# Patient Record
Sex: Female | Born: 1975 | Hispanic: No | Marital: Married | Smoking: Never smoker
Health system: Southern US, Community
[De-identification: ages and names within clinical notes are randomized; demographics above are authoritative.]

## PROBLEM LIST (undated history)

## (undated) DIAGNOSIS — K219 Gastro-esophageal reflux disease without esophagitis: Secondary | ICD-10-CM

---

## 2010-07-29 ENCOUNTER — Ambulatory Visit: Payer: Self-pay | Admitting: General Practice

## 2012-08-06 ENCOUNTER — Ambulatory Visit (INDEPENDENT_AMBULATORY_CARE_PROVIDER_SITE_OTHER): Payer: 59 | Admitting: Internal Medicine

## 2012-08-06 ENCOUNTER — Encounter: Payer: Self-pay | Admitting: Internal Medicine

## 2012-08-06 VITALS — BP 118/70 | HR 79 | Temp 97.3°F | Resp 18 | Ht 62.0 in | Wt 90.0 lb

## 2012-08-06 DIAGNOSIS — R6889 Other general symptoms and signs: Secondary | ICD-10-CM

## 2012-08-06 DIAGNOSIS — B977 Papillomavirus as the cause of diseases classified elsewhere: Secondary | ICD-10-CM

## 2012-08-06 DIAGNOSIS — E559 Vitamin D deficiency, unspecified: Secondary | ICD-10-CM

## 2012-08-06 DIAGNOSIS — IMO0002 Reserved for concepts with insufficient information to code with codable children: Secondary | ICD-10-CM

## 2012-08-06 DIAGNOSIS — E785 Hyperlipidemia, unspecified: Secondary | ICD-10-CM

## 2012-08-06 DIAGNOSIS — R61 Generalized hyperhidrosis: Secondary | ICD-10-CM

## 2012-08-06 DIAGNOSIS — R87619 Unspecified abnormal cytological findings in specimens from cervix uteri: Secondary | ICD-10-CM

## 2012-08-06 NOTE — Progress Notes (Signed)
Subjective:    Patient ID: Charlene Rogers, female    DOB: [birth year], 37 y.o.   MRN: [id number]  HPI New pt here for first visit for consultation kindly referred by Arnette Felts PA at [facility].   Her  GYN care is by D Dr. Juliene Pina.  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    Pt reports S/S complex that has been going on for quite some time.  She is here to see me as she is wondering if her night sweats are hormonoally related.  She describes chronic issues of her stomach and placed herself on a wheat free diete which seems to help.  She describes her vitamin D level is very low and she is being supplemented with Vitamin D by her Gyn Md.  She also recently was given folic acid by her GYN for reasons that  pt reports of HPV positivity  and an abnormal pap.  I do not have any of the GYN records.   She tells me she is tired. She is a single mother raising a 38 yo.  She has not been sexually active for the past 5-6 years She is G1P1AB0.  LMP 06/16/2012 and she describes flow as 3-5 days.  She reports no irregular menses.  Regarding her sweats,  She states they only occur during the night , none during the day. They have been more frequent over the last 4 weeks.    Sweating occurs under her arms, across her chest, and  a few times on her upper lip.  She denies diarrhea, she noted one time her ears were red but She denies persistant flushing during the sweating episodes.  She states around her menses she does feels tachycardia occasionally.   Her BP has always been normal by her reports.  She denies weight loss.  At times after her menses she has some epigastric pain that only last 2-3 minutes.  NO L arm or jaw pain, no radiation to back, no heartburn.  Folic acid was started in September and she stopped this and thinks perhaps symtptoms improved.   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  She was counseled to take TB prophylaxis medication but she read the side effects and did not take any medication. She is unsure who evaluated her for her positive PPD.  Review of her records from [place] CXR done 06/2012 revealing no acute process.    Upon review of records kindly sent by Arnette Felts,  Her FSH is 6.7,  TSH  3.5,  LH  4.3 and estradiol 30.9.  She states labs were drawn approx 10 days after her cycle.  She denies any lumps or masses in neck or under arms  Allergies not on file No past medical history on file. No past surgical history on file. History   Social History  . Marital Status: Divorced    Spouse Name: N/A    Number of Children: N/A  . Years of Education: N/A   Occupational History  . Not on file.   Social History Main Topics  . Smoking status: Not on file  . Smokeless tobacco: Not on file  . Alcohol Use: Not on file  . Drug Use: Not on file  .  Sexually Active: Not on file   Other Topics Concern  . Not on file   Social History Narrative  . No narrative on file   No family history on file. There is no problem list on file for this patient.  No current outpatient prescriptions on file prior to visit.   No current facility-administered medications on file prior to visit.       Review of Systems See HPI    Objective:   Physical Exam Physical Exam  Nursing note and vitals reviewed.  Alert, smiling  Constitutional: She is oriented to person, place, and time. She appears well-developed and well-nourished.  HENT:  Head: Normocephalic and atraumatic.  Neck:  No thyromegaly and no nodules felt on exam Cardiovascular: Normal rate and regular rhythm. Exam reveals no gallop and no friction rub.  No murmur heard.  Pulmonary/Chest: Breath sounds normal. She has no wheezes. She has no rales.  Neurological: She is alert and oriented to  person, place, and time.  Lymph  I do not palpate enlarged lymph nodes in neck, clavicle, axillary of femoral areas. Skin: Skin is warm and dry.  Psychiatric: She has a normal mood and affect. Her behavior is normal. ExtL:  No edema           Assessment & Plan:  NIght sweats:  I counseled pt it is very unlikely that her sweating is due to a lack of or diminution of estrogen or progesterone especially in light of lab findings and regular menses.  She has no symptoms of carcinoid syndrome or pheochromocytoma.    She does not wish lab testing at this time for carcinoid or pheochromotcytoma but I would watch for any presenting signs  in the furture and re-offer testing at that time.  TSH is normal as is her thyroid exam.     PPD positive.  She grew up in an endemic area for TB exposure,  Shehad a normal CXR in January but I did strongly counsel pt that she should take TB prophylaxis meds.  She declines at this time despite my counsel   Pt is encouraged to return if any worsening symptoms, diarrhea, persistant flushing, weight loss, or hypertension occurs.

## 2012-08-07 ENCOUNTER — Encounter: Payer: Self-pay | Admitting: Internal Medicine

## 2012-08-07 DIAGNOSIS — IMO0002 Reserved for concepts with insufficient information to code with codable children: Secondary | ICD-10-CM | POA: Insufficient documentation

## 2012-08-07 DIAGNOSIS — R61 Generalized hyperhidrosis: Secondary | ICD-10-CM | POA: Insufficient documentation

## 2012-08-07 DIAGNOSIS — R87619 Unspecified abnormal cytological findings in specimens from cervix uteri: Secondary | ICD-10-CM | POA: Insufficient documentation

## 2012-08-07 DIAGNOSIS — E785 Hyperlipidemia, unspecified: Secondary | ICD-10-CM | POA: Insufficient documentation

## 2012-08-07 DIAGNOSIS — E559 Vitamin D deficiency, unspecified: Secondary | ICD-10-CM | POA: Insufficient documentation

## 2012-08-07 NOTE — Patient Instructions (Addendum)
See me as needed 

## 2012-10-13 ENCOUNTER — Encounter (HOSPITAL_BASED_OUTPATIENT_CLINIC_OR_DEPARTMENT_OTHER): Payer: Self-pay | Admitting: *Deleted

## 2012-10-13 ENCOUNTER — Emergency Department (HOSPITAL_BASED_OUTPATIENT_CLINIC_OR_DEPARTMENT_OTHER): Payer: 59

## 2012-10-13 ENCOUNTER — Emergency Department (HOSPITAL_BASED_OUTPATIENT_CLINIC_OR_DEPARTMENT_OTHER)
Admission: EM | Admit: 2012-10-13 | Discharge: 2012-10-13 | Disposition: A | Discharge status: Home / Self Care | Payer: 59 | Attending: Emergency Medicine | Admitting: Emergency Medicine

## 2012-10-13 DIAGNOSIS — R0602 Shortness of breath: Secondary | ICD-10-CM | POA: Insufficient documentation

## 2012-10-13 DIAGNOSIS — I10 Essential (primary) hypertension: Secondary | ICD-10-CM | POA: Insufficient documentation

## 2012-10-13 DIAGNOSIS — R51 Headache: Secondary | ICD-10-CM | POA: Insufficient documentation

## 2012-10-13 DIAGNOSIS — K219 Gastro-esophageal reflux disease without esophagitis: Secondary | ICD-10-CM | POA: Insufficient documentation

## 2012-10-13 DIAGNOSIS — R000 Tachycardia, unspecified: Secondary | ICD-10-CM | POA: Insufficient documentation

## 2012-10-13 LAB — CBC WITH DIFFERENTIAL/PLATELET
Eosinophils Absolute: 0 10*3/uL (ref 0.0–0.7)
Hemoglobin: 13.6 g/dL (ref 12.0–15.0)
Lymphocytes Relative: 19 % (ref 12–46)
Lymphs Abs: 1.1 10*3/uL (ref 0.7–4.0)
MCH: 31.9 pg (ref 26.0–34.0)
Monocytes Relative: 5 % (ref 3–12)
Neutrophils Relative %: 76 % (ref 43–77)
RBC: 4.26 MIL/uL (ref 3.87–5.11)
WBC: 5.7 10*3/uL (ref 4.0–10.5)

## 2012-10-13 LAB — BASIC METABOLIC PANEL
BUN: 5 mg/dL — ABNORMAL LOW (ref 6–23)
CO2: 24 mEq/L (ref 19–32)
Chloride: 100 mEq/L (ref 96–112)
GFR calc non Af Amer: >90 mL/min (ref >90)
Glucose, Bld: 122 mg/dL — ABNORMAL HIGH (ref 70–99)
Potassium: 3.3 mEq/L — ABNORMAL LOW (ref 3.5–5.1)

## 2012-10-13 LAB — TROPONIN I
Troponin I: <0.3 ng/mL (ref <0.30)
Troponin I: <0.3 ng/mL (ref <0.30)

## 2012-10-13 MED ORDER — OMEPRAZOLE 20 MG PO CPDR: 20.0000 mg | DELAYED_RELEASE_CAPSULE | Freq: Every day | ORAL | Status: DC

## 2012-10-13 MED ORDER — ASPIRIN 81 MG PO CHEW
324.0000 mg | CHEWABLE_TABLET | Freq: Once | ORAL | Status: AC
  Administered 2012-10-13: 324 mg via ORAL
  Filled 2012-10-13: qty 4

## 2012-10-13 MED ORDER — SODIUM CHLORIDE 0.9 % IV BOLUS (SEPSIS)
1000.0000 mL | Freq: Once | INTRAVENOUS | Status: AC
  Administered 2012-10-13: 1000 mL via INTRAVENOUS

## 2012-10-13 MED ORDER — GI COCKTAIL ~~LOC~~
30.0000 mL | Freq: Once | ORAL | Status: AC
  Administered 2012-10-13: 30 mL via ORAL
  Filled 2012-10-13: qty 30

## 2012-10-13 NOTE — ED Provider Notes (Signed)
History     CSN: [id number]  Arrival date & time 10/13/12  1730   First MD Initiated Contact with Patient 10/13/12 1731      Chief Complaint  Patient presents with  . Chest Pain    (Consider location/radiation/quality/duration/timing/severity/associated sxs/prior treatment) HPI Comments: Patient presents emergency department with chief complaint of chest pain. Patient states that she felt some sharp chest pain on left side of her chest early this morning. She also states that she had some "heaviness" to her left arm, with associated shortness of breath and clamminess. She states that she was not doing anything when she first noticed the symptoms. She states that following the symptoms, she needed to have a bowel movement, which did not affect her symptoms. She also complains of mild headache, and states that her shortness of breath makes her feel like something is caught in her throat. She is only having mild pain now. She is tried taking bystolic for her symptoms.  She has a history of tachycardia and hypertension. She states that her heart normally races prior to having her period, however her heart rate has been quick today, and her last menstrual period was 10 days ago  The history is provided by the patient. No language interpreter was used.    Past Medical History  Diagnosis Date  . Hypertension     History reviewed. No pertinent past surgical history.  Family History  Problem Relation Age of Onset  . Hypertension Mother   . Heart disease Maternal Grandmother   . Heart disease Maternal Grandfather     History  Substance Use Topics  . Smoking status: Never Smoker   . Smokeless tobacco: Not on file  . Alcohol Use: No    OB History   Grav Para Term Preterm Abortions TAB SAB Ect Mult Living   1         1      Review of Systems  All other systems reviewed and are negative.    Allergies  Review of patient's allergies indicates no known allergies.  Home  Medications   Current Outpatient Rx  Name  Route  Sig  Dispense  Refill  . cholecalciferol (VITAMIN D) 1000 UNITS tablet   Oral   Take 2,000 Units by mouth every other day.         . MULTIPLE MINERALS PO   Oral   Take by mouth.           BP 147/85  Pulse 138  Temp(Src) 98.4 F (36.9 C) (Oral)  Resp 20  Ht 5\' 2"  (1.575 m)  Wt 86 lb (39.009 kg)  BMI 15.73 kg/m2  SpO2 100%  LMP 10/06/2012  Physical Exam  Nursing note and vitals reviewed. Constitutional: She is oriented to person, place, and time. She appears well-developed and well-nourished.  HENT:  Head: Normocephalic and atraumatic.  Eyes: Conjunctivae and EOM are normal. Pupils are equal, round, and reactive to light.  Neck: Normal range of motion. Neck supple.  Cardiovascular: Regular rhythm.  Exam reveals no gallop and no friction rub.   No murmur heard. Tachycardia  Pulmonary/Chest: Effort normal and breath sounds normal. No respiratory distress. She has no wheezes. She has no rales. She exhibits no tenderness.  Abdominal: Soft. Bowel sounds are normal. She exhibits no distension and no mass. There is no tenderness. There is no rebound and no guarding.  Musculoskeletal: Normal range of motion. She exhibits no edema and no tenderness.  Neurological: She is alert and  oriented to person, place, and time.  Skin: Skin is warm and dry.  Psychiatric: She has a normal mood and affect. Her behavior is normal. Judgment and thought content normal.    ED Course  Procedures (including critical care time)  Labs Reviewed  TROPONIN I  CBC WITH DIFFERENTIAL  BASIC METABOLIC PANEL   Results for orders placed during the hospital encounter of 10/13/12  TROPONIN I      Result Value Range   Troponin I <0.30  <0.30 ng/mL  CBC WITH DIFFERENTIAL      Result Value Range   WBC 5.7  4.0 - 10.5 K/uL   RBC 4.26  3.87 - 5.11 MIL/uL   Hemoglobin 13.6  12.0 - 15.0 g/dL   HCT 81.1  91.4 - 78.2 %   MCV 90.4  78.0 - 100.0 fL   MCH  31.9  26.0 - 34.0 pg   MCHC 35.3  30.0 - 36.0 g/dL   RDW 95.6  21.3 - 08.6 %   Platelets 255  150 - 400 K/uL   Neutrophils Relative 76  43 - 77 %   Neutro Abs 4.3  1.7 - 7.7 K/uL   Lymphocytes Relative 19  12 - 46 %   Lymphs Abs 1.1  0.7 - 4.0 K/uL   Monocytes Relative 5  3 - 12 %   Monocytes Absolute 0.3  0.1 - 1.0 K/uL   Eosinophils Relative 1  0 - 5 %   Eosinophils Absolute 0.0  0.0 - 0.7 K/uL   Basophils Relative 0  0 - 1 %   Basophils Absolute 0.0  0.0 - 0.1 K/uL  BASIC METABOLIC PANEL      Result Value Range   Sodium 139  135 - 145 mEq/L   Potassium 3.3 (*) 3.5 - 5.1 mEq/L   Chloride 100  96 - 112 mEq/L   CO2 24  19 - 32 mEq/L   Glucose, Bld 122 (*) 70 - 99 mg/dL   BUN 5 (*) 6 - 23 mg/dL   Creatinine, Ser 5.78  0.50 - 1.10 mg/dL   Calcium 46.9  8.4 - 62.9 mg/dL   GFR calc non Af Amer >90  >90 mL/min   GFR calc Af Amer >90  >90 mL/min  D-DIMER, QUANTITATIVE      Result Value Range   D-Dimer, Quant <0.27  0.00 - 0.48 ug/mL-FEU  TROPONIN I      Result Value Range   Troponin I <0.30  <0.30 ng/mL   Dg Chest 2 View  10/13/2012  *RADIOLOGY REPORT*  Clinical Data: Shortness of breath  CHEST - 2 VIEW  Comparison: None.  Findings: Cardiomediastinal silhouette is unremarkable.  No acute infiltrate or pleural effusion.  No pulmonary edema.  Bony thorax is unremarkable.  IMPRESSION: No active disease.   Original Report Authenticated By: Natasha Mead, M.D.      ED ECG REPORT  I personally interpreted this EKG   Date: 10/13/2012   Rate: 112  Rhythm: sinus tachycardia  QRS Axis: normal  Intervals: normal  ST/T Wave abnormalities: normal  Conduction Disutrbances:none  Narrative Interpretation:   Old EKG Reviewed: none available    1. GERD (gastroesophageal reflux disease)       MDM  Patient presents with chest pain and shortness of breath. Worrisome for PE, will obtain d-dimer.  Patient also has tachycardia, but this is not new for here, and has been evaluated by cardiology.   She takes bystolic for this, but has recently quit  taking the bystolic.  D-dimer is negative.  Doubt ACS given the patient's risk factors and previously negative stress test.  However, will check a second troponin.  Patient has been seen by and discussed with Dr. Anitra Lauth, who tells me that this is likely more GERD or anxiety related.  Will give GI cocktail.  Patient feels better with GI cocktail.  Will discharge with omeprazole.  PCP/cardiology follow-up.  Patient understands and agrees with the plan.  She is stable and ready for discharge.  Discussed with Dr. Anitra Lauth who agrees with the plan.        Roxy Horseman, PA-C 10/13/12 2155

## 2012-10-13 NOTE — ED Notes (Signed)
Pt c/o H/A, something stuck in throat, SHOB, chest tightness, head congestion, "gas", left arm discomfort onset this a.m.

## 2012-10-14 NOTE — ED Provider Notes (Signed)
Medical screening examination/treatment/procedure(s) were conducted as a shared visit with non-physician practitioner(s) and myself.  I personally evaluated the patient during the encounter Pt with multiple work ups in the past for CP and tachycardia without findings from cardiology or ob/gyn and normal FSH, TSH, progesterone.  Normal stress test and echo in the last year.  Pt has been under a lot of stress and sx sound more like GERD.  Tachycardia improved to 101 prior to d/c.  Gwyneth Sprout, MD 10/14/12 2200

## 2014-02-05 ENCOUNTER — Other Ambulatory Visit: Payer: Self-pay | Admitting: Gastroenterology

## 2014-02-05 DIAGNOSIS — R110 Nausea: Secondary | ICD-10-CM

## 2014-02-19 ENCOUNTER — Ambulatory Visit (HOSPITAL_COMMUNITY): Admission: RE | Admit: 2014-02-19 | Payer: 59 | Source: Ambulatory Visit

## 2014-02-19 ENCOUNTER — Ambulatory Visit (HOSPITAL_COMMUNITY): Payer: 59

## 2014-04-13 ENCOUNTER — Encounter (HOSPITAL_BASED_OUTPATIENT_CLINIC_OR_DEPARTMENT_OTHER): Payer: Self-pay | Admitting: *Deleted

## 2014-07-19 ENCOUNTER — Encounter (HOSPITAL_BASED_OUTPATIENT_CLINIC_OR_DEPARTMENT_OTHER): Payer: Self-pay | Admitting: *Deleted

## 2014-07-19 ENCOUNTER — Observation Stay (HOSPITAL_BASED_OUTPATIENT_CLINIC_OR_DEPARTMENT_OTHER)
Admission: EM | Admit: 2014-07-19 | Discharge: 2014-07-20 | Disposition: A | Discharge status: Home / Self Care | Payer: 59 | Attending: Internal Medicine | Admitting: Internal Medicine

## 2014-07-19 ENCOUNTER — Emergency Department (HOSPITAL_BASED_OUTPATIENT_CLINIC_OR_DEPARTMENT_OTHER): Payer: 59

## 2014-07-19 DIAGNOSIS — Z8659 Personal history of other mental and behavioral disorders: Secondary | ICD-10-CM

## 2014-07-19 DIAGNOSIS — E785 Hyperlipidemia, unspecified: Secondary | ICD-10-CM | POA: Insufficient documentation

## 2014-07-19 DIAGNOSIS — K219 Gastro-esophageal reflux disease without esophagitis: Secondary | ICD-10-CM | POA: Diagnosis not present

## 2014-07-19 DIAGNOSIS — I214 Non-ST elevation (NSTEMI) myocardial infarction: Secondary | ICD-10-CM

## 2014-07-19 DIAGNOSIS — R0789 Other chest pain: Principal | ICD-10-CM | POA: Insufficient documentation

## 2014-07-19 DIAGNOSIS — I10 Essential (primary) hypertension: Secondary | ICD-10-CM | POA: Diagnosis not present

## 2014-07-19 DIAGNOSIS — R079 Chest pain, unspecified: Secondary | ICD-10-CM | POA: Diagnosis present

## 2014-07-19 DIAGNOSIS — R7989 Other specified abnormal findings of blood chemistry: Secondary | ICD-10-CM

## 2014-07-19 DIAGNOSIS — E559 Vitamin D deficiency, unspecified: Secondary | ICD-10-CM | POA: Diagnosis present

## 2014-07-19 DIAGNOSIS — R778 Other specified abnormalities of plasma proteins: Secondary | ICD-10-CM | POA: Diagnosis present

## 2014-07-19 HISTORY — DX: Gastro-esophageal reflux disease without esophagitis: K21.9

## 2014-07-19 LAB — CBC WITH DIFFERENTIAL/PLATELET
BASOS ABS: 0 10*3/uL (ref 0.0–0.1)
BASOS PCT: 1 % (ref 0–1)
EOS PCT: 1 % (ref 0–5)
Eosinophils Absolute: 0.1 10*3/uL (ref 0.0–0.7)
HEMATOCRIT: 39.9 % (ref 36.0–46.0)
HEMOGLOBIN: 13.4 g/dL (ref 12.0–15.0)
Lymphocytes Relative: 18 % (ref 12–46)
Lymphs Abs: 1.1 10*3/uL (ref 0.7–4.0)
MCH: 30.2 pg (ref 26.0–34.0)
MCHC: 33.6 g/dL (ref 30.0–36.0)
MCV: 90.1 fL (ref 78.0–100.0)
MONO ABS: 0.4 10*3/uL (ref 0.1–1.0)
Monocytes Relative: 7 % (ref 3–12)
NEUTROS PCT: 73 % (ref 43–77)
Neutro Abs: 4.3 10*3/uL (ref 1.7–7.7)
Platelets: 286 10*3/uL (ref 150–400)
RBC: 4.43 MIL/uL (ref 3.87–5.11)
RDW: 12.7 % (ref 11.5–15.5)
WBC: 5.9 10*3/uL (ref 4.0–10.5)

## 2014-07-19 LAB — COMPREHENSIVE METABOLIC PANEL
ALT: 13 U/L (ref 0–35)
AST: 23 U/L (ref 0–37)
Albumin: 5.1 g/dL (ref 3.5–5.2)
Alkaline Phosphatase: 68 U/L (ref 39–117)
Anion gap: 8 (ref 5–15)
BILIRUBIN TOTAL: 1.2 mg/dL (ref 0.3–1.2)
BUN: 9 mg/dL (ref 6–23)
CHLORIDE: 101 mmol/L (ref 96–112)
CO2: 28 mmol/L (ref 19–32)
Calcium: 9.5 mg/dL (ref 8.4–10.5)
Creatinine, Ser: 0.59 mg/dL (ref 0.50–1.10)
GFR calc non Af Amer: >90 mL/min (ref >90)
Glucose, Bld: 97 mg/dL (ref 70–99)
Potassium: 3.8 mmol/L (ref 3.5–5.1)
SODIUM: 137 mmol/L (ref 135–145)
TOTAL PROTEIN: 8.5 g/dL — AB (ref 6.0–8.3)

## 2014-07-19 LAB — RAPID URINE DRUG SCREEN, HOSP PERFORMED
Amphetamines: NOT DETECTED
BENZODIAZEPINES: NOT DETECTED
Barbiturates: POSITIVE — AB
Cocaine: NOT DETECTED
Opiates: NOT DETECTED
TETRAHYDROCANNABINOL: NOT DETECTED

## 2014-07-19 LAB — URINALYSIS, ROUTINE W REFLEX MICROSCOPIC
BILIRUBIN URINE: NEGATIVE
Glucose, UA: NEGATIVE mg/dL
Hgb urine dipstick: NEGATIVE
KETONES UR: NEGATIVE mg/dL
LEUKOCYTES UA: NEGATIVE
NITRITE: NEGATIVE
Protein, ur: NEGATIVE mg/dL
SPECIFIC GRAVITY, URINE: 1.002 — AB (ref 1.005–1.030)
UROBILINOGEN UA: 0.2 mg/dL (ref 0.0–1.0)
pH: 7 (ref 5.0–8.0)

## 2014-07-19 LAB — LIPASE, BLOOD: Lipase: 33 U/L (ref 11–59)

## 2014-07-19 LAB — PREGNANCY, URINE: PREG TEST UR: NEGATIVE

## 2014-07-19 LAB — TSH: TSH: 2.797 u[IU]/mL (ref 0.350–4.500)

## 2014-07-19 LAB — TROPONIN I
TROPONIN I: 0.07 ng/mL — AB (ref <0.031)
Troponin I: 0.14 ng/mL — ABNORMAL HIGH (ref <0.031)
Troponin I: 0.07 ng/mL — ABNORMAL HIGH (ref <0.031)

## 2014-07-19 LAB — HEPARIN LEVEL (UNFRACTIONATED): HEPARIN UNFRACTIONATED: 0.46 [IU]/mL (ref 0.30–0.70)

## 2014-07-19 IMAGING — CT CT ANGIO CHEST
2 of 6 series · 19 of 36 positions shown · IV contrast (APPLIED)
Comparison: Chest radiograph [DATE]

CLINICAL DATA: Initial encounter for upper chest pain for 2 weeks
with shortness of breath. Fatigue. Dizziness. History of
hypertension.

EXAM:
CT ANGIOGRAPHY CHEST WITH CONTRAST
TECHNIQUE: Multidetector CT imaging of the chest was performed using the
standard protocol during bolus administration of intravenous
contrast. Multiplanar CT image reconstructions and MIPs were
obtained to evaluate the vascular anatomy.
CONTRAST:  100mL OMNIPAQUE IOHEXOL 350 MG/ML SOLN

[Series 8: pe 1.0 b26f · axial · 0.49mm/px · z∈[-285,-67]mm · 18 of 244 slices shown]
[im 13/244  lung]
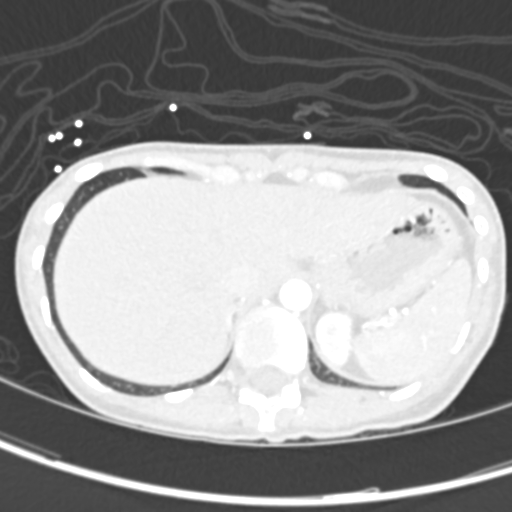
[im 25/244  mediastinal]
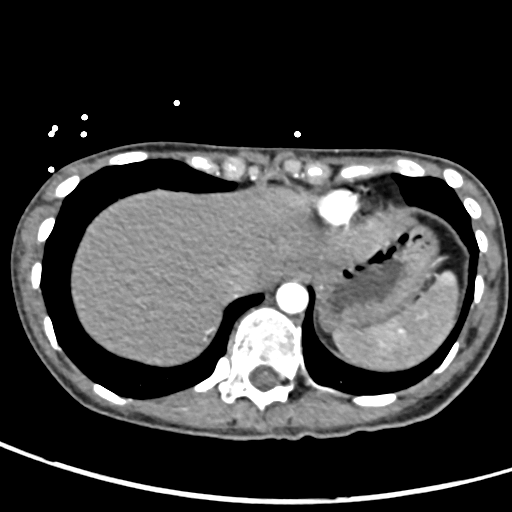
[im 37/244  lung]
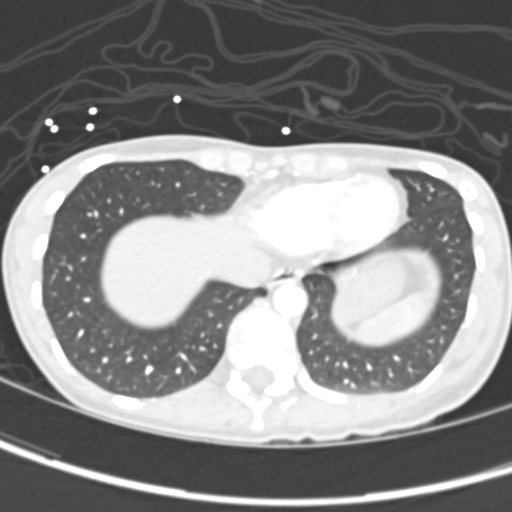
[im 49/244  mediastinal]
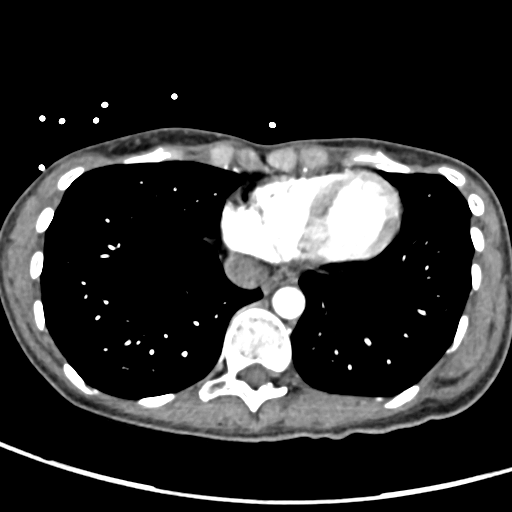
[im 61/244  lung]
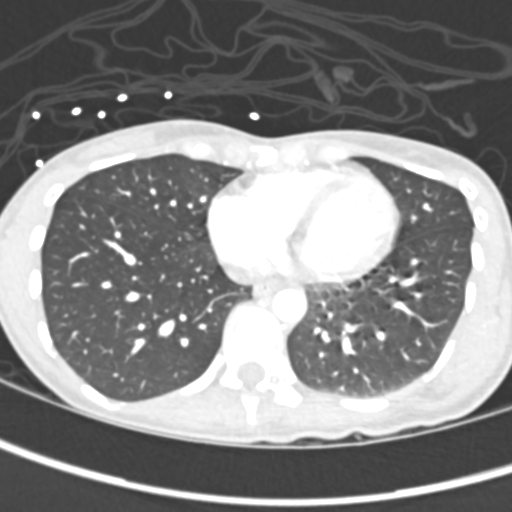
[im 73/244  mediastinal]
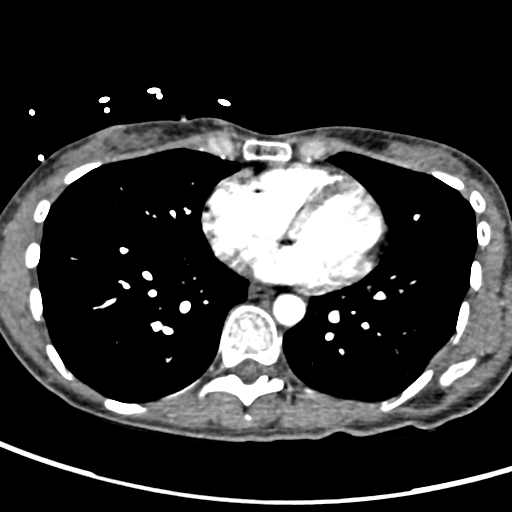
[im 86/244  lung]
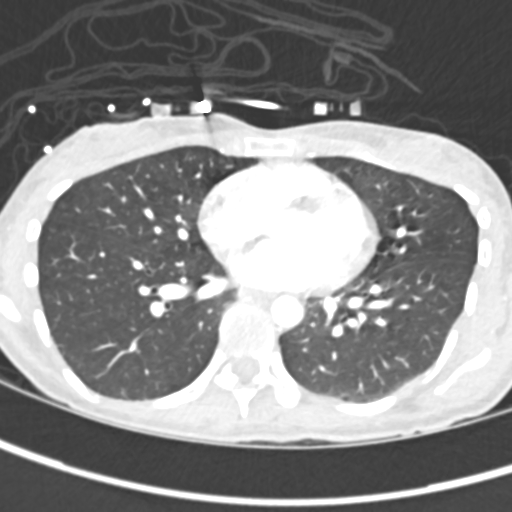
[im 98/244  mediastinal]
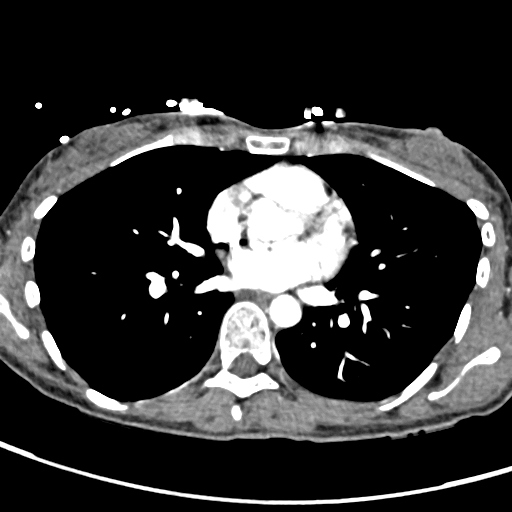
[im 110/244  lung]
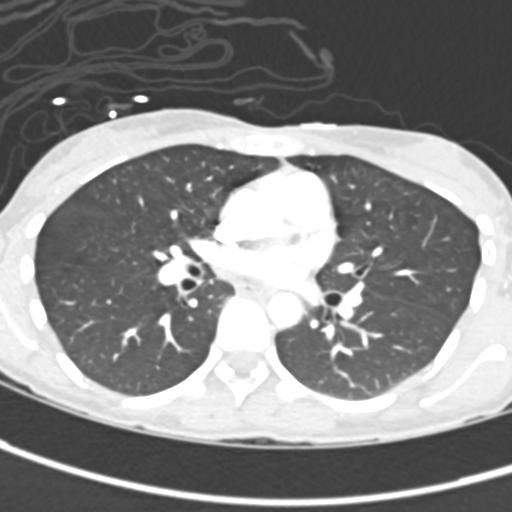
[im 134/244  mediastinal]
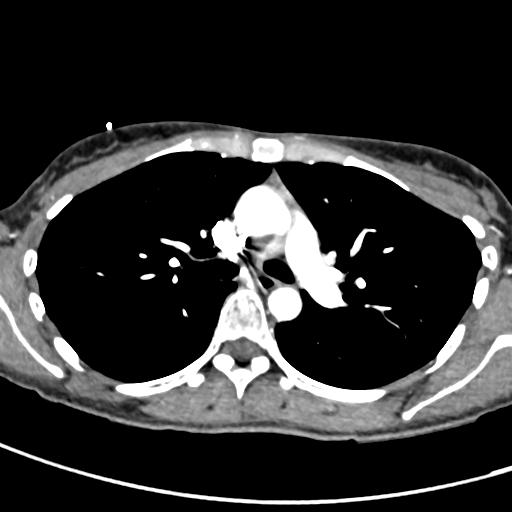
[im 146/244  lung]
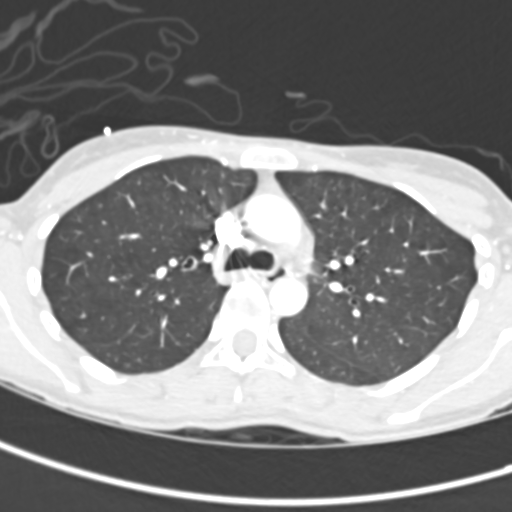
[im 158/244  mediastinal]
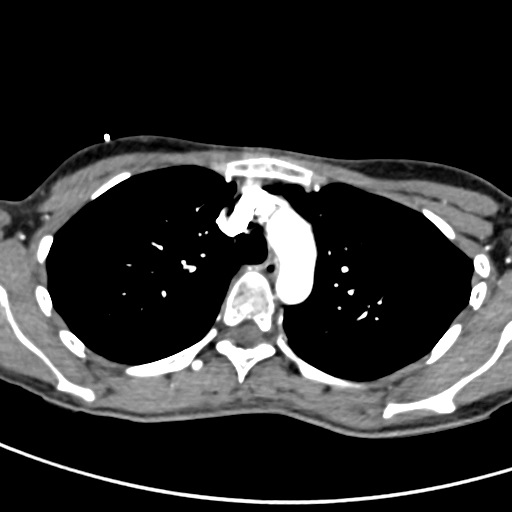
[im 171/244  lung]
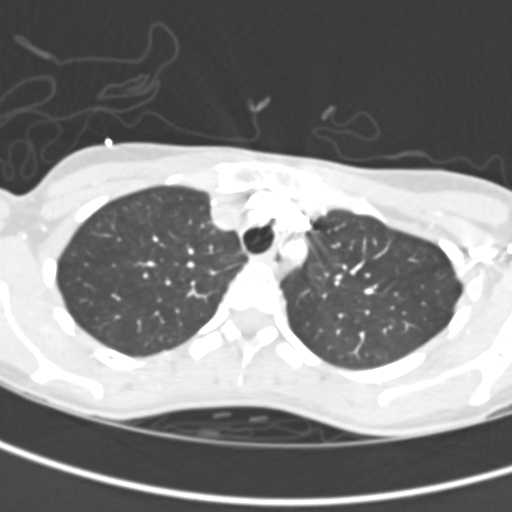
[im 183/244  mediastinal]
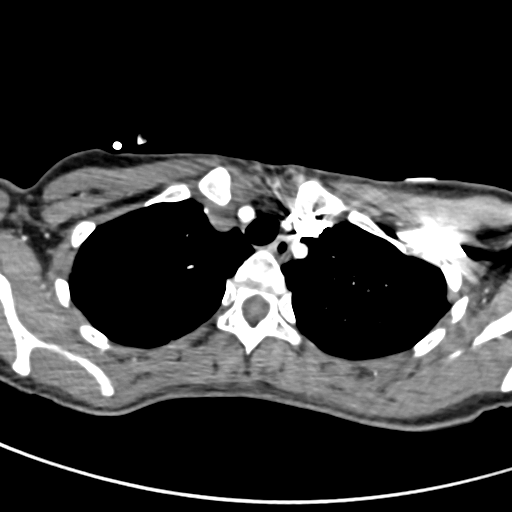
[im 195/244  lung]
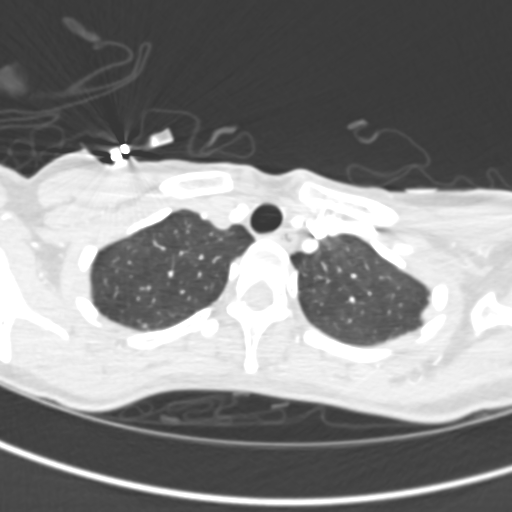
[im 207/244  mediastinal]
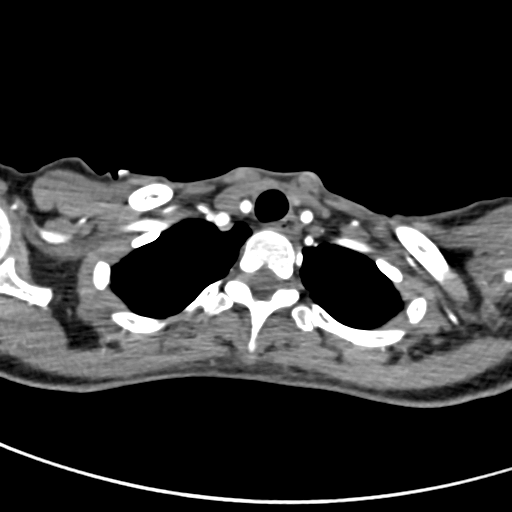
[im 219/244  lung]
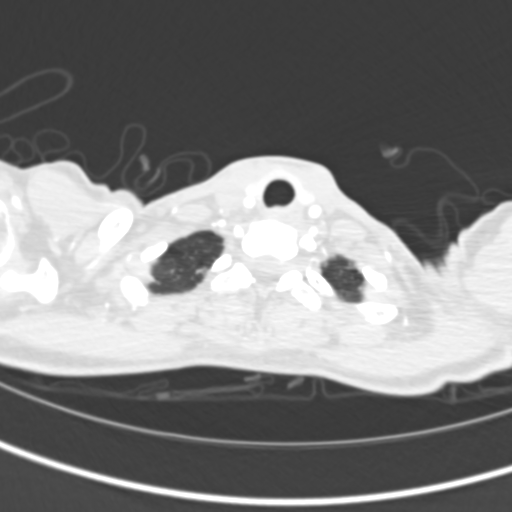
[im 231/244  mediastinal]
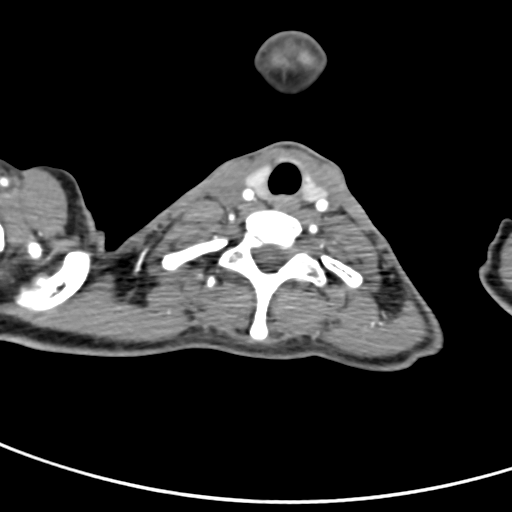

[Series 11: pe 2.0 coronal · coronal · 0.50mm/px · 1 of 77 slices shown]
[im 39/77  mediastinal]
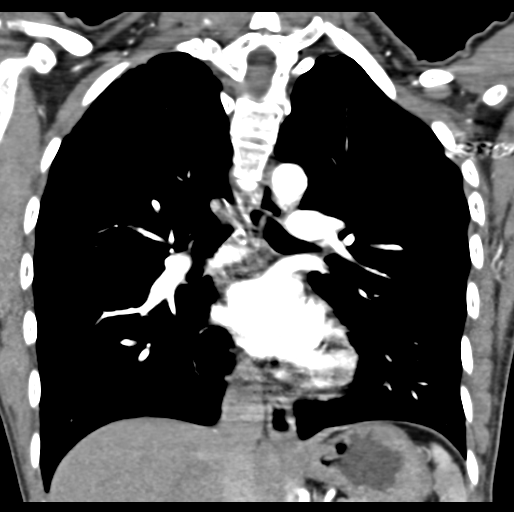

[19 of 36 positions shown; findings below may reference images not displayed]

FINDINGS: Mediastinum/Nodes: The quality of this exam for evaluation of
pulmonary embolism is good. No evidence of pulmonary embolism.

Normal aortic caliber without dissection. Heart size accentuated by
a mild pectus excavatum deformity. No pericardial effusion. No
mediastinal or hilar adenopathy.

Lungs/Pleura: No airspace opacities.  No pleural fluid.

Upper abdomen: Normal imaged portions of the liver, spleen, stomach,
left kidney, and adrenal glands.

Musculoskeletal: No acute osseous abnormality.

Review of the MIP images confirms the above findings.
IMPRESSION: 1.  No evidence of pulmonary embolism.
2. No other explanation for patient's symptoms.

## 2014-07-19 MED ORDER — HEPARIN BOLUS VIA INFUSION
2400.0000 [IU] | Freq: Once | INTRAVENOUS | Status: AC
  Administered 2014-07-19: 2400 [IU] via INTRAVENOUS

## 2014-07-19 MED ORDER — ONDANSETRON HCL 4 MG/2ML IJ SOLN: 4.0000 mg | Freq: Four times a day (QID) | INTRAMUSCULAR | Status: DC | PRN

## 2014-07-19 MED ORDER — SODIUM CHLORIDE 0.9 % IJ SOLN
3.0000 mL | Freq: Two times a day (BID) | INTRAMUSCULAR | Status: DC
Start: 2014-07-19 — End: 2014-07-20
  Administered 2014-07-19: 3 mL via INTRAVENOUS

## 2014-07-19 MED ORDER — HEPARIN (PORCINE) IN NACL 100-0.45 UNIT/ML-% IJ SOLN
500.0000 [IU]/h | INTRAMUSCULAR | Status: DC
  Administered 2014-07-19: 500 [IU]/h via INTRAVENOUS
  Filled 2014-07-19 (×2): qty 250

## 2014-07-19 MED ORDER — ALUM & MAG HYDROXIDE-SIMETH 200-200-20 MG/5ML PO SUSP: 30.0000 mL | Freq: Four times a day (QID) | ORAL | Status: DC | PRN

## 2014-07-19 MED ORDER — ASPIRIN EC 81 MG PO TBEC
81.0000 mg | DELAYED_RELEASE_TABLET | Freq: Every day | ORAL | Status: DC
  Administered 2014-07-20: 81 mg via ORAL
  Filled 2014-07-19: qty 1

## 2014-07-19 MED ORDER — FAMOTIDINE 20 MG PO TABS
20.0000 mg | ORAL_TABLET | Freq: Two times a day (BID) | ORAL | Status: DC
  Administered 2014-07-19 – 2014-07-20 (×2): 20 mg via ORAL
  Filled 2014-07-19 (×2): qty 1

## 2014-07-19 MED ORDER — VITAMIN D 1000 UNITS PO TABS: 2000.0000 [IU] | ORAL_TABLET | ORAL | Status: DC

## 2014-07-19 MED ORDER — POTASSIUM CHLORIDE IN NACL 20-0.9 MEQ/L-% IV SOLN
INTRAVENOUS | Status: DC
Start: 2014-07-19 — End: 2014-07-20
  Administered 2014-07-19 – 2014-07-20 (×3): via INTRAVENOUS
  Filled 2014-07-19 (×4): qty 1000

## 2014-07-19 MED ORDER — GI COCKTAIL ~~LOC~~
30.0000 mL | Freq: Once | ORAL | Status: AC
  Administered 2014-07-19: 30 mL via ORAL
  Filled 2014-07-19: qty 30

## 2014-07-19 MED ORDER — HYDROCODONE-ACETAMINOPHEN 5-325 MG PO TABS
1.0000 | ORAL_TABLET | Freq: Four times a day (QID) | ORAL | Status: DC | PRN
Start: 2014-07-19 — End: 2014-07-20

## 2014-07-19 MED ORDER — ASPIRIN 325 MG PO TABS
325.0000 mg | ORAL_TABLET | Freq: Once | ORAL | Status: AC
  Administered 2014-07-19: 325 mg via ORAL
  Filled 2014-07-19: qty 1

## 2014-07-19 MED ORDER — SENNOSIDES-DOCUSATE SODIUM 8.6-50 MG PO TABS: 1.0000 | ORAL_TABLET | Freq: Every evening | ORAL | Status: DC | PRN

## 2014-07-19 MED ORDER — ACETAMINOPHEN 650 MG RE SUPP: 650.0000 mg | Freq: Four times a day (QID) | RECTAL | Status: DC | PRN

## 2014-07-19 MED ORDER — ONDANSETRON HCL 4 MG PO TABS: 4.0000 mg | ORAL_TABLET | Freq: Four times a day (QID) | ORAL | Status: DC | PRN

## 2014-07-19 MED ORDER — ACETAMINOPHEN 325 MG PO TABS: 650.0000 mg | ORAL_TABLET | Freq: Four times a day (QID) | ORAL | Status: DC | PRN

## 2014-07-19 MED ORDER — LORAZEPAM 0.5 MG PO TABS
0.5000 mg | ORAL_TABLET | Freq: Two times a day (BID) | ORAL | Status: DC | PRN
  Administered 2014-07-19: 0.5 mg via ORAL
  Filled 2014-07-19: qty 1

## 2014-07-19 MED ORDER — IOHEXOL 350 MG/ML SOLN
100.0000 mL | Freq: Once | INTRAVENOUS | Status: AC | PRN
  Administered 2014-07-19: 100 mL via INTRAVENOUS

## 2014-07-19 MED ORDER — ATORVASTATIN CALCIUM 80 MG PO TABS
80.0000 mg | ORAL_TABLET | Freq: Every day | ORAL | Status: DC
  Administered 2014-07-19: 80 mg via ORAL
  Filled 2014-07-19: qty 1

## 2014-07-19 NOTE — Progress Notes (Signed)
Dr. Littie DeedsGentry called from [facility] Ms t Allena Katzatel is a [age]yr female with no known medical history presented to [facility] due to intermittent chest pain, radiating to back, neck with hot flushes for wks and worsening x1d, presented to ED, negative CTA per Dr. Littie DeedsGentry. no acute EKG changes, but does has elevated troponin 0.14. Admit to tele, cycle enzyme, echo, cards consult.

## 2014-07-19 NOTE — Progress Notes (Signed)
ANTICOAGULATION CONSULT NOTE  Pharmacy Consult for heparin Indication: chest pain/ACS  No Known Allergies  Patient Measurements: Height: 5\' 1"  (154.9 cm) Weight: 85 lb 3.2 oz (38.646 kg) IBW/kg (Calculated) : 47.8 Heparin Dosing Weight: 39.5kg  Vital Signs: Temp: 98.1 F (36.7 C) (02/07 2120) Temp Source: Oral (02/07 2120) BP: 102/59 mmHg (02/07 2120) Pulse Rate: 88 (02/07 2120)  Labs:  Recent Labs  07/19/14 1155 07/19/14 1823 07/19/14 2245  HGB 13.4  --   --   HCT 39.9  --   --   PLT 286  --   --   HEPARINUNFRC  --   --  0.46  CREATININE 0.59  --   --   TROPONINI 0.14* 0.07*  --     Estimated Creatinine Clearance: 58.7 mL/min (by C-G formula based on Cr of 0.59).  Assessment: 39 y.o. female with chest pain for heparin   Goal of Therapy:  Heparin level 0.3-0.7 units/ml Monitor platelets by anticoagulation protocol: Yes   Plan:  Continue Heparin at current rate Follow-up am labs.  Eddie Candlebbott, [place] Raymond [place] 07/19/2014,11:27 PM

## 2014-07-19 NOTE — H&P (Signed)
[facility] History and Physical  Charlene Rogers ZOX:[id number] DOB: Dec 21, 1975 DOA: 07/19/2014  Referring physician: EDP PCP: No primary care provider on file.   Chief Complaint: chest pain  HPI: Charlene Rogers is a 39 y.o. [place] female  With h/o GERD, panic attacks, "borderline" hyperlipidemia presents with multiple symptoms. Left chest soreness, left axillary pain, scapula pain. Has been experiencing intermittently for several weeks. Not associated with activity. Last for a few minutes. Today, was much more severe so she came to the emergency room. She also complains of dizziness, "tingling" in her head. "Butterflies" on her ankles and right chest. Sweats, dyspnea. She was referred to an endocrinologist at [facility] who prescribed Bystolic and Dexilant, but patient stopped both. She was to have a workup for pheochromocytoma. Had a normal TSH recently.  In the emergency room, EKG was unremarkable, but troponin mildly elevated at 0.14. CT angiogram of the chest showed no PE or other abnormality.   Review of Systems:  Systems reviewed. As above otherwise negative.  Past Medical History  Diagnosis Date       History reviewed. No pertinent past surgical history. Social History:  reports that she has never smoked. She does not have any smokeless tobacco history on file. She reports that she does not drink alcohol or use illicit drugs. she is recently remarried. She has an 46-year-old daughter. She works as a Designer, industrial/product.  No Known Allergies  Family History  Problem Relation Age of Onset  . Hypertension Mother   . Heart disease Maternal Grandmother   . Heart disease Maternal Grandfather     Prior to Admission medications   Medication Sig Start Date End Date Taking? Authorizing Provider  Ranitidine HCl (ZANTAC PO) Take by mouth daily.   Yes Historical Provider, MD  cholecalciferol (VITAMIN D) 1000 UNITS tablet Take 2,000 Units by mouth every other day.    Historical Provider, MD    MULTIPLE MINERALS PO Take by mouth.    Historical Provider, MD   Physical Exam: Filed Vitals:   07/19/14 1501 07/19/14 1518 07/19/14 1557 07/19/14 1704  BP: 139/82  129/81 122/72  Pulse: 100  104 90  Temp:    98.6 F (37 C)  TempSrc:    Oral  Resp: Height:   (1.549 m)   (1.549 m)  Weight:  39.463 kg (87 lb)  38.646 kg (85 lb 3.2 oz)  SpO2: 100%  100% 100%    Wt Readings from Last 3 Encounters:  07/19/14 38.646 kg (85 lb 3.2 oz)  10/13/12 39.009 kg (86 lb)  08/06/12 40.824 kg (90 lb)  BP 122/72 mmHg  Pulse 90  Temp(Src) 98.6 F (37 C) (Oral)  Resp 18  Ht  (1.549 m)  Wt 38.646 kg (85 lb 3.2 oz)  BMI 16.11 kg/m2  SpO2 100%  LMP 07/05/2014 (Approximate)  General Appearance:    [name] female, slightly anxious appearing. Cooperative. Speech somewhat pressured.   Head:    Normocephalic, without obvious abnormality, atraumatic  Eyes:    PERRL, conjunctiva/corneas clear, EOM's intact, fundi    benign, both eyes  Nose:   Nares normal, septum midline, mucosa normal, no drainage    or sinus tenderness  Throat:   Lips, mucosa, and tongue normal; teeth and gums normal  Neck:   Supple, symmetrical, trachea midline, no adenopathy;    thyroid:  no enlargement/tenderness/nodules; no carotid   bruit or JVD  Back:     Symmetric, no  curvature, ROM normal, no CVA tenderness  Lungs:     Clear to auscultation bilaterally, respirations unlabored  Chest Wall:    No tenderness or deformity   Heart:    Regular rate and rhythm, S1 and S2 normal, no murmur, rub   or gallop  Breast Exam:    No tenderness, masses, or nipple abnormality  Abdomen:     Soft, non-tender, bowel sounds active,    no masses, no organomegaly  Genitalia:   deferred  Rectal:   deferred  Extremities:   Extremities normal, atraumatic, no cyanosis or edema  Pulses:   2+ and symmetric all extremities  Skin:   Skin color, texture, turgor normal, no rashes or lesions  Lymph nodes:    Cervical, supraclavicular, and axillary nodes normal  Neurologic:   CNII-XII intact, normal strength, sensation and reflexes    throughout    Psych:  anxious        Labs on Admission:  Basic Metabolic Panel:  Recent Labs Lab 07/19/14 1155  NA 137  K 3.8  CL 101  CO2 28  GLUCOSE 97  BUN 9  CREATININE 0.59  CALCIUM 9.5   Liver Function Tests:  Recent Labs Lab 07/19/14 1155  AST 23  ALT 13  ALKPHOS 68  BILITOT 1.2  PROT 8.5*  ALBUMIN 5.1    Recent Labs Lab 07/19/14 1155  LIPASE 33   No results for input(s): AMMONIA in the last 168 hours. CBC:  Recent Labs Lab 07/19/14 1155  WBC 5.9  NEUTROABS 4.3  HGB 13.4  HCT 39.9  MCV 90.1  PLT 286   Cardiac Enzymes:  Recent Labs Lab 07/19/14 1155  TROPONINI 0.14*    BNP (last 3 results) No results for input(s): BNP in the last 8760 hours.  ProBNP (last 3 results) No results for input(s): PROBNP in the last 8760 hours.  CBG: No results for input(s): GLUCAP in the last 168 hours.  Radiological Exams on Admission: Dg Chest 2 View  07/19/2014   CLINICAL DATA:  Pt states she has had pain across her upper chest x 2 weeks with dizziness and sob. States pain became worse 2 days ago. Hx of htn.  EXAM: CHEST  2 VIEW  COMPARISON:  10/13/2012  FINDINGS: The heart size and mediastinal contours are within normal limits. Both lungs are clear. The visualized skeletal structures are unremarkable.  IMPRESSION: No active cardiopulmonary disease.   Electronically Signed   By: Elberta Fortis M.D.   On: 07/19/2014 12:06   Ct Angio Chest Pe W/cm &/or Wo Cm  07/19/2014   CLINICAL DATA:  Initial encounter for upper chest pain for 2 weeks with shortness of breath. Fatigue. Dizziness. History of hypertension.  EXAM: CT ANGIOGRAPHY CHEST WITH CONTRAST  TECHNIQUE: Multidetector CT imaging of the chest was performed using the standard protocol during bolus administration of intravenous contrast. Multiplanar CT image reconstructions and MIPs  were obtained to evaluate the vascular anatomy.  CONTRAST:  OMNIPAQUE IOHEXOL 350 MG/ML SOLN  COMPARISON:  Chest radiograph 07/19/2014  FINDINGS: Mediastinum/Nodes: The quality of this exam for evaluation of pulmonary embolism is good. No evidence of pulmonary embolism.  Normal aortic caliber without dissection. Heart size accentuated by a mild pectus excavatum deformity. No pericardial effusion. No mediastinal or hilar adenopathy.  Lungs/Pleura: No airspace opacities.  No pleural fluid.  Upper abdomen: Normal imaged portions of the liver, spleen, stomach, left kidney, and adrenal glands.  Musculoskeletal: No acute osseous abnormality.  Review of the MIP  images confirms the above findings.  IMPRESSION: 1.  No evidence of pulmonary embolism. 2. No other explanation for patient's symptoms.   Electronically Signed   By: Jeronimo GreavesKyle  Talbot M.D.   On: 07/19/2014 14:55    EKG: tracing reviewed. NSR  Assessment/Plan  Principal Problem:   Elevated troponin:  Asa already given.  Symptoms extremely atypical for ACS, and patient has no risk factor.  Heparin gtt started by edp. Will trend. Discussed with Dr. Leeann MustJacob Kelly. He will consult. Will get B12, folate. Active Problems:   Vitamin D deficiency   Chest pain   History of panic attacks   GERD (gastroesophageal reflux disease): only takes zantac. Will give pepcid  Time spent: 50 min  Caterra Ostroff L [facility] [email or web address] password [email or web address]

## 2014-07-19 NOTE — Progress Notes (Signed)
ANTICOAGULATION CONSULT NOTE - Initial Consult  Pharmacy Consult for heparin Indication: chest pain/ACS  No Known Allergies  Patient Measurements: Height: 5\' 1"  (154.9 cm) Weight: 87 lb (39.463 kg) IBW/kg (Calculated) : 47.8 Heparin Dosing Weight: 39.5kg  Vital Signs: Temp: 97.7 F (36.5 C) (02/07 1104) Temp Source: Oral (02/07 1104) BP: 139/82 mmHg (02/07 1501) Pulse Rate: 100 (02/07 1501)  Labs:  Recent Labs  07/19/14 1155  HGB 13.4  HCT 39.9  PLT 286  CREATININE 0.59  TROPONINI 0.14*    Estimated Creatinine Clearance: 60 mL/min (by C-G formula based on Cr of 0.59).   Medical History: Past Medical History  Diagnosis Date  . Hypertension     Medications:  Infusions:  . heparin    . heparin      Assessment: 38 yof presented to the ED with CP. Troponin mildly elevated. To start IV heparin for anticoagulation. Baseline CBC is WNL. CT is negative for PE.   Goal of Therapy:  Heparin level 0.3-0.7 units/ml Monitor platelets by anticoagulation protocol: Yes   Plan:  1. Heparin bolus 2400 units IV x 1 2. Heparin gtt 500 units/hr 3. Check a 6 hour heparin level 4. Daily heparin level and CBC  [place] [facility], Drake Leachachel Lynn 07/19/2014,3:26 PM

## 2014-07-19 NOTE — Consult Note (Signed)
Reason for Consult: chest pain, mildly elevated troponin Referring Physician: Dr. [name] is an 39 y.o. female.  HPI: [facility] [facility] is a 39 yo woman with PMH of hypertension on bysystolic, dyslipidemia who was seen in [place] [facility] earlier today after presentation for chest pain. She characterizes the pain as intermittent, lasting 4-5 minutes and sharp in nature. Today she has some side/flank/back involvement that is more achy. She also notes some neck discomfort and associated hot flushing for several weeks that is worse over the last 24 hours. She has had severe reflux before and treatment for h pylori + with EGD that she says was unrevealing. She also had what sounds like a negative nuclear stress test in [place] [facility] in [place] but she is changing her insurance. In the ER she had a negative CTA for PE but a mildly elevated troponin leading to heparin initiation and transfer to [place] [facility] for observation and cardiology consultation. She recently traveled to [place] for approximately 1 month about 1 month ago. She works in a lab.     Past Medical History  Diagnosis Date  . Hypertension    History as above Family History  Problem Relation Age of Onset  . Hypertension Mother   . Heart disease Maternal Grandmother   . Heart disease Maternal Grandfather     Social History:  reports that she has never smoked. She does not have any smokeless tobacco history on file. She reports that she does not drink alcohol or use illicit drugs.  Allergies: No Known Allergies  Medications:  I have reviewed the patient's current medications. Prior to Admission:  Prescriptions prior to admission  Medication Sig Dispense Refill Last Dose  . Ranitidine HCl (ZANTAC PO) Take by mouth daily.     . [DISCONTINUED] omeprazole (PRILOSEC) 20 MG capsule Take 1 capsule (20 mg total) by mouth daily. 30 capsule 0   . cholecalciferol (VITAMIN D) 1000  UNITS tablet Take 2,000 Units by mouth every other day.     . MULTIPLE MINERALS PO Take by mouth.      Scheduled: . [START ON 07/20/2014] aspirin EC  81 mg Oral Daily  . cholecalciferol  2,000 Units Oral QODAY  . famotidine  20 mg Oral BID  . sodium chloride  3 mL Intravenous Q12H    Results for orders placed or performed during the [facility] encounter of 07/19/14 (from the past 48 hour(s))  Urinalysis, Routine w reflex microscopic     Status: Abnormal   Collection Time: 07/19/14 11:48 AM  Result Value Ref Range   Color, Urine YELLOW YELLOW   APPearance CLEAR CLEAR   Specific Gravity, Urine 1.002 (L) 1.005 - 1.030   pH 7.0 5.0 - 8.0   Glucose, UA NEGATIVE NEGATIVE mg/dL   Hgb urine dipstick NEGATIVE NEGATIVE   Bilirubin Urine NEGATIVE NEGATIVE   Ketones, ur NEGATIVE NEGATIVE mg/dL   Protein, ur NEGATIVE NEGATIVE mg/dL   Urobilinogen, UA 0.2 0.0 - 1.0 mg/dL   Nitrite NEGATIVE NEGATIVE   Leukocytes, UA NEGATIVE NEGATIVE    Comment: MICROSCOPIC NOT DONE ON URINES WITH NEGATIVE PROTEIN, BLOOD, LEUKOCYTES, NITRITE, OR GLUCOSE <1000 mg/dL.  Pregnancy, urine     Status: None   Collection Time: 07/19/14 11:48 AM  Result Value Ref Range   Preg Test, Ur NEGATIVE NEGATIVE    Comment:        THE SENSITIVITY OF THIS METHODOLOGY IS >20 mIU/mL.   Comprehensive metabolic panel  Status: Abnormal   Collection Time: 07/19/14 11:55 AM  Result Value Ref Range   Sodium 137 135 - 145 mmol/L   Potassium 3.8 3.5 - 5.1 mmol/L   Chloride 101 96 - 112 mmol/L   CO2 28 19 - 32 mmol/L   Glucose, Bld 97 70 - 99 mg/dL   BUN 9 6 - 23 mg/dL   Creatinine, Ser 0.59 0.50 - 1.10 mg/dL   Calcium 9.5 8.4 - 10.5 mg/dL   Total Protein 8.5 (H) 6.0 - 8.3 g/dL   Albumin 5.1 3.5 - 5.2 g/dL   AST 23 0 - 37 U/L   ALT 13 0 - 35 U/L   Alkaline Phosphatase 68 39 - 117 U/L   Total Bilirubin 1.2 0.3 - 1.2 mg/dL   GFR calc non Af Amer >90 >90 mL/min   GFR calc Af Amer >90 >90 mL/min    Comment: (NOTE) The eGFR has  been calculated using the CKD EPI equation. This calculation has not been validated in all clinical situations. eGFR's persistently <90 mL/min signify possible Chronic Kidney Disease.    Anion gap 8 5 - 15  Troponin I (order at [facility])     Status: Abnormal   Collection Time: 07/19/14 11:55 AM  Result Value Ref Range   Troponin I 0.14 (H) <0.031 ng/mL    Comment:        PERSISTENTLY INCREASED TROPONIN VALUES IN THE RANGE OF 0.04-0.49 ng/mL CAN BE SEEN IN:       -UNSTABLE ANGINA       -CONGESTIVE HEART FAILURE       -MYOCARDITIS       -CHEST TRAUMA       -ARRYHTHMIAS       -LATE PRESENTING MYOCARDIAL INFARCTION       -COPD   CLINICAL FOLLOW-UP RECOMMENDED.   CBC with Differential     Status: None   Collection Time: 07/19/14 11:55 AM  Result Value Ref Range   WBC 5.9 4.0 - 10.5 K/uL   RBC 4.43 3.87 - 5.11 MIL/uL   Hemoglobin 13.4 12.0 - 15.0 g/dL   HCT 39.9 36.0 - 46.0 %   MCV 90.1 78.0 - 100.0 fL   MCH 30.2 26.0 - 34.0 pg   MCHC 33.6 30.0 - 36.0 g/dL   RDW 12.7 11.5 - 15.5 %   Platelets 286 150 - 400 K/uL   Neutrophils Relative % 73 43 - 77 %   Neutro Abs 4.3 1.7 - 7.7 K/uL   Lymphocytes Relative 18 12 - 46 %   Lymphs Abs 1.1 0.7 - 4.0 K/uL   Monocytes Relative 7 3 - 12 %   Monocytes Absolute 0.4 0.1 - 1.0 K/uL   Eosinophils Relative 1 0 - 5 %   Eosinophils Absolute 0.1 0.0 - 0.7 K/uL   Basophils Relative 1 0 - 1 %   Basophils Absolute 0.0 0.0 - 0.1 K/uL  Lipase, blood     Status: None   Collection Time: 07/19/14 11:55 AM  Result Value Ref Range   Lipase 33 11 - 59 U/L    Dg Chest 2 View  07/19/2014   CLINICAL DATA:  Pt states she has had pain across her upper chest x 2 weeks with dizziness and sob. States pain became worse 2 days ago. Hx of htn.  EXAM: CHEST  2 VIEW  COMPARISON:  10/13/2012  FINDINGS: The heart size and mediastinal contours are within normal limits. Both lungs are clear. The visualized skeletal structures are  unremarkable.  IMPRESSION: No active  cardiopulmonary disease.   Electronically Signed   By: [facility] [facility] M.D.   On: 07/19/2014 12:06   Ct Angio Chest Pe W/cm &/or Wo Cm  07/19/2014   CLINICAL DATA:  Initial encounter for upper chest pain for 2 weeks with shortness of breath. Fatigue. Dizziness. History of hypertension.  EXAM: CT ANGIOGRAPHY CHEST WITH CONTRAST  TECHNIQUE: Multidetector CT imaging of the chest was performed using the standard protocol during bolus administration of intravenous contrast. Multiplanar CT image reconstructions and MIPs were obtained to evaluate the vascular anatomy.  CONTRAST:  15m OMNIPAQUE IOHEXOL 350 MG/ML SOLN  COMPARISON:  Chest radiograph 07/19/2014  FINDINGS: Mediastinum/Nodes: The quality of this exam for evaluation of pulmonary embolism is good. No evidence of pulmonary embolism.  Normal aortic caliber without dissection. Heart size accentuated by a mild pectus excavatum deformity. No pericardial effusion. No mediastinal or hilar adenopathy.  Lungs/Pleura: No airspace opacities.  No pleural fluid.  Upper abdomen: Normal imaged portions of the liver, spleen, stomach, left kidney, and adrenal glands.  Musculoskeletal: No acute osseous abnormality.  Review of the MIP images confirms the above findings.  IMPRESSION: 1.  No evidence of pulmonary embolism. 2. No other explanation for patient's symptoms.   Electronically Signed   By: KAbigail MiyamotoM.D.   On: 07/19/2014 14:55    Review of Systems  Constitutional: Negative for fever, chills, malaise/fatigue and diaphoresis.  HENT: Negative for ear pain.   Eyes: Negative for double vision and photophobia.  Respiratory: Negative for cough and hemoptysis.   Cardiovascular: Positive for chest pain. Negative for palpitations, orthopnea, claudication, leg swelling and PND.  Gastrointestinal: Positive for heartburn. Negative for nausea, vomiting and abdominal pain.  Genitourinary: Negative for urgency and frequency.  Musculoskeletal: Positive for back pain and neck  pain. Negative for falls.  Skin: Negative for rash.  Neurological: Positive for dizziness. Negative for tingling, tremors and headaches.  Endo/Heme/Allergies: Negative for polydipsia. Does not bruise/bleed easily.  Psychiatric/Behavioral: Negative for depression, suicidal ideas and substance abuse.   Blood pressure 122/72, pulse 90, temperature 98.6 F (37 C), temperature source Oral, resp. rate 18, height _0  (1.549 m), weight 38.646 kg (85 lb 3.2 oz), last menstrual period 07/05/2014, SpO2 100 %. Physical Exam  Nursing note and vitals reviewed. Constitutional: She is oriented to person, place, and time. She appears well-developed and well-nourished. No distress.  HENT:  Head: Normocephalic and atraumatic.  Nose: Nose normal.  Mouth/Throat: Oropharynx is clear and moist. No oropharyngeal exudate.  Eyes: Conjunctivae and EOM are normal. Pupils are equal, round, and reactive to light. No scleral icterus.  Neck: Normal range of motion. Neck supple. No JVD present. No tracheal deviation present.  Cardiovascular: Normal rate, regular rhythm, normal heart sounds and intact distal pulses.  Exam reveals no gallop.   No murmur heard. Respiratory: Effort normal and breath sounds normal. No respiratory distress. She has no wheezes. She has no rales.  GI: Soft. Bowel sounds are normal. She exhibits no distension. There is no tenderness. There is no rebound.  Musculoskeletal: Normal range of motion. She exhibits no edema or tenderness.  Neurological: She is alert and oriented to person, place, and time. No cranial nerve deficit.  Skin: Skin is warm and dry. No rash noted. She is not diaphoretic. No erythema.  Psychiatric: She has a normal mood and affect. Her behavior is normal. Thought content normal.   Labs reviewed; bun/cr 9/0.6 CTA negative for PE; chest x-ray unrevealing EKG: SR,  HR 97, nonspecific ST changes (sub 1/2 mm St depression unchanged from ECG in 2014)  Assessment/Plan: [facility]  is a 39 yo woman with PMH of hypertension, dyslipidemia and previous evaluation/workup for tachycardia and chest pain who presents with chest pain, hot flushes and found to have mildly elevated troponin. Differential includes demand ischemia, spurious lab finding, esophageal spasm, ACS, myopericarditis among other etiologies. She does have a mild family history of cardiovascular disease but her young age, female gender, atypical symptoms and previously reportedly negative workup including echo/stress test are reassuring. For now, will trend troponins overnight, not unreasonable to continue heparin since it has been started, watch on telemetry, update labs and review echocardiogram in AM. I discussed the broad differential for now. She also has multiple serologies and labwork per the medicine team.   - trend cardiac markers - observation on telemetry - asa 81 mg, atorvastatin 80 mg qHS, first dose now - hba1c, tsh, lipid panel, BNP - NPO after MN except for medications/sips   [facility] 07/19/2014, 5:41 PM

## 2014-07-19 NOTE — ED Provider Notes (Signed)
CSN: [id number]     Arrival date & time 07/19/14  1047 History   First MD Initiated Contact with Patient 07/19/14 1054     Chief Complaint  Patient presents with  . Chest Pain     (Consider location/radiation/quality/duration/timing/severity/associated sxs/prior Treatment) Patient is a 39 y.o. female presenting with chest pain.  Chest Pain Pain location:  Substernal area Pain quality: shooting and stabbing   Pain radiates to:  Upper back Pain radiates to the back: yes   Pain severity:  Moderate Onset quality:  Gradual Duration:  1 week Timing:  Intermittent (lasts 4-5 minutes) Progression:  Unchanged Chronicity:  New Context: at rest   Context: not breathing and not eating   Relieved by:  Nothing Worsened by:  Nothing tried Ineffective treatments:  None tried Associated symptoms: abdominal pain (intermittent) and shortness of breath   Associated symptoms: no anorexia, no cough, no dizziness, no nausea, no syncope and not vomiting     Past Medical History  Diagnosis Date  . GERD (gastroesophageal reflux disease)    History reviewed. No pertinent past surgical history. Family History  Problem Relation Age of Onset  . Hypertension Mother   . Heart disease Maternal Grandmother   . Heart disease Maternal Grandfather    History  Substance Use Topics  . Smoking status: Never Smoker   . Smokeless tobacco: Not on file  . Alcohol Use: No   OB History    Gravida Para Term Preterm AB TAB SAB Ectopic Multiple Living   1         1     Review of Systems  Respiratory: Positive for shortness of breath. Negative for cough.   Cardiovascular: Positive for chest pain. Negative for syncope.  Gastrointestinal: Positive for abdominal pain (intermittent). Negative for nausea, vomiting and anorexia.  Neurological: Negative for dizziness.  All other systems reviewed and are negative.     Allergies  Review of patient's allergies indicates no known allergies.  Home Medications    Prior to Admission medications   Medication Sig Start Date End Date Taking? Authorizing Provider  Cholecalciferol (VITAMIN D3) 5000 UNITS TABS Take 5,000 Units by mouth daily.   Yes Historical Provider, MD  Multiple Vitamin (MULTIVITAMIN WITH MINERALS) TABS tablet Take 2 tablets by mouth daily.   Yes Historical Provider, MD  ranitidine (ZANTAC) 150 MG tablet Take 150 mg by mouth daily.   Yes Historical Provider, MD  clonazePAM (KLONOPIN) 0.5 MG tablet Take 1 tablet (0.5 mg total) by mouth 2 (two) times daily as needed for anxiety. 07/20/14   Albertine Grates, MD   BP 112/63 mmHg  Pulse 73  Temp(Src) 98 F (36.7 C) (Oral)  Resp 16  Ht 5\' 1"  (1.549 m)  Wt 86 lb 10.3 oz (39.3 kg)  BMI 16.38 kg/m2  SpO2 100%  LMP 07/05/2014 (Approximate) Physical Exam  Constitutional: She is oriented to person, place, and time. She appears well-developed and well-nourished.  HENT:  Head: Normocephalic and atraumatic.  Right Ear: External ear normal.  Left Ear: External ear normal.  Eyes: Conjunctivae and EOM are normal. Pupils are equal, round, and reactive to light.  Neck: Normal range of motion. Neck supple.  Cardiovascular: Normal rate, regular rhythm, normal heart sounds and intact distal pulses.   Pulmonary/Chest: Effort normal and breath sounds normal.  Abdominal: Soft. Bowel sounds are normal. There is no tenderness.  Musculoskeletal: Normal range of motion.  Neurological: She is alert and oriented to person, place, and time.  Skin: Skin is  warm and dry.  Vitals reviewed.   ED Course  Procedures (including critical care time) Labs Review Labs Reviewed  COMPREHENSIVE METABOLIC PANEL - Abnormal; Notable for the following:    Total Protein 8.5 (*)    All other components within normal limits  TROPONIN I - Abnormal; Notable for the following:    Troponin I 0.14 (*)    All other components within normal limits  URINALYSIS, ROUTINE W REFLEX MICROSCOPIC - Abnormal; Notable for the following:     Specific Gravity, Urine 1.002 (*)    All other components within normal limits  URINE RAPID DRUG SCREEN (HOSP PERFORMED) - Abnormal; Notable for the following:    Barbiturates POSITIVE (*)    All other components within normal limits  TROPONIN I - Abnormal; Notable for the following:    Troponin I 0.07 (*)    All other components within normal limits  TROPONIN I - Abnormal; Notable for the following:    Troponin I 0.07 (*)    All other components within normal limits  TROPONIN I - Abnormal; Notable for the following:    Troponin I 0.06 (*)    All other components within normal limits  CBC WITH DIFFERENTIAL/PLATELET  LIPASE, BLOOD  PREGNANCY, URINE  HEPARIN LEVEL (UNFRACTIONATED)  HEPARIN LEVEL (UNFRACTIONATED)  CBC  VITAMIN B12  TSH  FOLATE RBC  HIV ANTIBODY (ROUTINE TESTING)    Imaging Review Dg Chest 2 View  07/19/2014   CLINICAL DATA:  Pt states she has had pain across her upper chest x 2 weeks with dizziness and sob. States pain became worse 2 days ago. Hx of htn.  EXAM: CHEST  2 VIEW  COMPARISON:  10/13/2012  FINDINGS: The heart size and mediastinal contours are within normal limits. Both lungs are clear. The visualized skeletal structures are unremarkable.  IMPRESSION: No active cardiopulmonary disease.   Electronically Signed   By: Elberta Fortis M.D.   On: 07/19/2014 12:06   Ct Angio Chest Pe W/cm &/or Wo Cm  07/19/2014   CLINICAL DATA:  Initial encounter for upper chest pain for 2 weeks with shortness of breath. Fatigue. Dizziness. History of hypertension.  EXAM: CT ANGIOGRAPHY CHEST WITH CONTRAST  TECHNIQUE: Multidetector CT imaging of the chest was performed using the standard protocol during bolus administration of intravenous contrast. Multiplanar CT image reconstructions and MIPs were obtained to evaluate the vascular anatomy.  CONTRAST:  OMNIPAQUE IOHEXOL 350 MG/ML SOLN  COMPARISON:  Chest radiograph 07/19/2014  FINDINGS: Mediastinum/Nodes: The quality of this exam  for evaluation of pulmonary embolism is good. No evidence of pulmonary embolism.  Normal aortic caliber without dissection. Heart size accentuated by a mild pectus excavatum deformity. No pericardial effusion. No mediastinal or hilar adenopathy.  Lungs/Pleura: No airspace opacities.  No pleural fluid.  Upper abdomen: Normal imaged portions of the liver, spleen, stomach, left kidney, and adrenal glands.  Musculoskeletal: No acute osseous abnormality.  Review of the MIP images confirms the above findings.  IMPRESSION: 1.  No evidence of pulmonary embolism. 2. No other explanation for patient's symptoms.   Electronically Signed   By: Jeronimo Greaves M.D.   On: 07/19/2014 14:55   Ct Coronary Morp W/cta Cor W/score W/ca W/cm &/or Wo/cm  07/20/2014   ADDENDUM REPORT: 07/20/2014 16:52  CLINICAL DATA:  39 year old female with atypical chest pain.  EXAM: Cardiac/Coronary  CT  TECHNIQUE: The patient was scanned on a Philips 256 scanner.  FINDINGS: A 120 kV prospective scan was triggered in the  descending thoracic aorta at 111 HU's. Axial non-contrast 3 mm slices were carried out through the heart. The data set was analyzed on a dedicated work station and scored using the Agatson method. Gantry rotation speed was 270 msecs and collimation was .9 mm. 50 mg of PO metoprolol,5 mg of iv Metoprolol and 0.4 mg of sl NTG was given. The 3D data set was reconstructed in 5% intervals of the 67-82 % of the R-R cycle. Diastolic phases were analyzed on a dedicated work station using MPR, MIP and VRT modes. The patient received 80 cc of contrast.  Aorta:  Normal caliber, no dissection, no calcifications.  Aortic Valve:  Trileaflet.  Coronary Arteries:  Normal origin, right dominance.  Left main - normal, no plaque  LAD is a large caliber long vessel that wraps around the apex and continues in the in inferior interventricular groove supplying part of the PDA territory. LAD gives rise to one diagonal branch.  LCX artery is a medium caliber  non-dominant vessel that gives rise to one OM branch. There is no plaque.  RCA is a large caliber dominant vessel that gives rise to a small PDA and PLVB. There is significant motion, however no obvious plaque is seen.  IMPRESSION: 1. Coronary calcium score of 0. This was 0 percentile for age and sex matched control.  2.  Normal origin of coronary arteries.  3.  Right dominance.  4. Study interpretation is affected by cardiac motion predominantly in RCA, however there appears to be no evidence of CAD.  Evaluate for non-cardiac sources of chest pain.  Tobias Alexander   Electronically Signed   By: Tobias Alexander   On: 07/20/2014 16:52   07/20/2014   EXAM: OVER-READ INTERPRETATION  CT CHEST  The following report is an over-read performed by radiologist Dr. Charline Bills of [facility] on 07/20/2014. This over-read does not include interpretation of cardiac or coronary anatomy or pathology. The coronary CTA interpretation by the cardiologist is attached.  COMPARISON:  CT chest dated 07/19/2014  FINDINGS: Visualized lungs are clear.  No suspicious pulmonary nodules.  Visualized soft tissues are unremarkable. No suspicious mediastinal lymphadenopathy.  No focal osseous lesions.  IMPRESSION: No significant extracardiac findings.  Electronically Signed: By: Charline Bills M.D. On: 07/20/2014 15:34     EKG Interpretation   Date/Time:  Sunday July 19 2014 10:55:57 EST Ventricular Rate:  97 PR Interval:  140 QRS Duration: 80 QT Interval:  322 QTC Calculation: 408 R Axis:   87 Text Interpretation:  Normal sinus rhythm Nonspecific ST abnormality  Abnormal ECG rate has decreased since last tracing Confirmed by Mirian Mo [phone]) on 07/19/2014 11:33:34 AM Also confirmed by Mirian Mo  [phone])  on 07/19/2014 3:05:28 PM     CRITICAL CARE Performed by: Mirian Mo   Total critical care time: 35 min  Critical care time was exclusive of separately billable procedures and  treating other patients.  Critical care was necessary to treat or prevent imminent or life-threatening deterioration.  Critical care was time spent personally by me on the following activities: development of treatment plan with patient and/or surrogate as well as nursing, discussions with consultants, evaluation of patient's response to treatment, examination of patient, obtaining history from patient or surrogate, ordering and performing treatments and interventions, ordering and review of laboratory studies, ordering and review of radiographic studies, pulse oximetry and re-evaluation of patient's condition.  MDM   Final diagnoses:  NSTEMI (non-ST elevated myocardial infarction)    38  y.o. female with pertinent PMH of HTN, GERD presents with chest pain as above.  Pain atypical for ACS, nonexertional and intermittent lasting approximately 4-5 minutes.  Trop elevated.  CT PE scan ordered and unremarkable.  Pt admitted for NSTEMI wu  I have reviewed all laboratory and imaging studies if ordered as above  1. NSTEMI (non-ST elevated myocardial infarction)   2. Chest pain         Mirian MoMatthew Gentry, MD 07/21/14 [phone]

## 2014-07-19 NOTE — ED Notes (Addendum)
Patient c/o generalized chest pain & fatigue that started two weeks ago with hot flashes & dizzy at times. She states that she was having her period and thought it may be related.. States she had some SOB and used vicks on her chest, but no relief. No Chest pain at this time. She states that she has acid reflux and started taking zantac. She was previously taking bystolic for elevated heartrate, but stopped taking on her own approx two months ago.

## 2014-07-20 ENCOUNTER — Observation Stay (HOSPITAL_COMMUNITY): Payer: 59

## 2014-07-20 DIAGNOSIS — K219 Gastro-esophageal reflux disease without esophagitis: Secondary | ICD-10-CM | POA: Diagnosis not present

## 2014-07-20 DIAGNOSIS — R079 Chest pain, unspecified: Secondary | ICD-10-CM | POA: Diagnosis not present

## 2014-07-20 DIAGNOSIS — R7989 Other specified abnormal findings of blood chemistry: Secondary | ICD-10-CM

## 2014-07-20 DIAGNOSIS — Z8659 Personal history of other mental and behavioral disorders: Secondary | ICD-10-CM | POA: Diagnosis not present

## 2014-07-20 DIAGNOSIS — R072 Precordial pain: Secondary | ICD-10-CM

## 2014-07-20 LAB — CBC
HCT: 37.1 % (ref 36.0–46.0)
Hemoglobin: 12.2 g/dL (ref 12.0–15.0)
MCH: 30 pg (ref 26.0–34.0)
MCHC: 32.9 g/dL (ref 30.0–36.0)
MCV: 91.2 fL (ref 78.0–100.0)
PLATELETS: 234 10*3/uL (ref 150–400)
RBC: 4.07 MIL/uL (ref 3.87–5.11)
RDW: 12.8 % (ref 11.5–15.5)
WBC: 4 10*3/uL (ref 4.0–10.5)

## 2014-07-20 LAB — TROPONIN I: TROPONIN I: 0.06 ng/mL — AB (ref <0.031)

## 2014-07-20 LAB — HEPARIN LEVEL (UNFRACTIONATED): Heparin Unfractionated: 0.39 IU/mL (ref 0.30–0.70)

## 2014-07-20 LAB — VITAMIN B12: Vitamin B-12: 538 pg/mL (ref 211–911)

## 2014-07-20 MED ORDER — NITROGLYCERIN 0.4 MG SL SUBL: 0.4000 mg | SUBLINGUAL_TABLET | SUBLINGUAL | Status: DC | PRN

## 2014-07-20 MED ORDER — METOPROLOL TARTRATE 1 MG/ML IV SOLN
INTRAVENOUS | Status: AC
  Administered 2014-07-20: 5 mg via INTRAVENOUS
  Filled 2014-07-20: qty 5

## 2014-07-20 MED ORDER — NITROGLYCERIN 0.4 MG SL SUBL
SUBLINGUAL_TABLET | SUBLINGUAL | Status: AC
  Filled 2014-07-20: qty 1

## 2014-07-20 MED ORDER — METOPROLOL TARTRATE 1 MG/ML IV SOLN: 5.0000 mg | INTRAVENOUS | Status: DC | PRN

## 2014-07-20 MED ORDER — METOPROLOL TARTRATE 50 MG PO TABS
50.0000 mg | ORAL_TABLET | Freq: Once | ORAL | Status: AC
  Administered 2014-07-20: 50 mg via ORAL
  Filled 2014-07-20: qty 1

## 2014-07-20 MED ORDER — CLONAZEPAM 0.5 MG PO TABS: 0.5000 mg | ORAL_TABLET | Freq: Two times a day (BID) | ORAL | Status: AC | PRN

## 2014-07-20 MED ORDER — IOHEXOL 350 MG/ML SOLN
80.0000 mL | Freq: Once | INTRAVENOUS | Status: AC | PRN
  Administered 2014-07-20: 80 mL via INTRAVENOUS

## 2014-07-20 NOTE — Progress Notes (Signed)
ANTICOAGULATION CONSULT NOTE - Follow Up Consult  Pharmacy Consult for Heparin Indication: chest pain/ACS  No Known Allergies  Patient Measurements: Height: 5\' 1"  (154.9 cm) Weight: 86 lb 10.3 oz (39.3 kg) IBW/kg (Calculated) : 47.8 Heparin Dosing Weight: 39.3 kg  Vital Signs: Temp: 98.1 F (36.7 C) (02/08 0520) Temp Source: Oral (02/08 0520) BP: 109/61 mmHg (02/08 0520) Pulse Rate: 95 (02/08 0520)  Labs:  Recent Labs  07/19/14 1155 07/19/14 1823 07/19/14 2245 07/20/14 0936  HGB 13.4  --   --  12.2  HCT 39.9  --   --  37.1  PLT 286  --   --  234  HEPARINUNFRC  --   --  0.46 0.39  CREATININE 0.59  --   --   --   TROPONINI 0.14* 0.07* 0.07*  --     Estimated Creatinine Clearance: 59.7 mL/min (by C-G formula based on Cr of 0.59).   Assessment: CP, elevated troponins PMH: HTN, HLD  AC: Heparin for CP/ACS - CBC is WNL, troponin elevated, CT neg for PE, HL 0.39 in goal. Hgb down slightly to 12.2.  Cardiovascular: HLD, HTN. VSS. Meds: [id number], Lipitor80  Endocrinology: TSH WNL.  Gastrointestinal / Nutrition: Vit D def, severe GERD. Vit D, Pepcid  Neurology: panic attacks  Nephrology: Scr 0.59  Hematology / Oncology  PTA Medication Issues: MV Best Practices  Goal of Therapy:  Heparin level 0.3-0.7 units/ml Monitor platelets by anticoagulation protocol: Yes   Plan:  Continue heparin 500 units/hr Daily HL, CBC   Erice Ahles S. Merilynn Finlandobertson, PharmD, BCPS Clinical Staff Pharmacist Pager [phone]  Misty Stanleyobertson, Kayli Beal Stillinger 07/20/2014,10:44 AM

## 2014-07-20 NOTE — Progress Notes (Signed)
  Echocardiogram 2D Echocardiogram has been performed.  Janalyn HarderWest, Shirley Bolle R 07/20/2014, 1:15 PM

## 2014-07-20 NOTE — Progress Notes (Signed)
Pt's cardiac CT was normal per Dr. Delton SeeNelson and radiology and echo was normal.  I notified pt by phone, we stopped heparin and she will be discharged.  She will follow up with PCP.  Spurious troponin reading.

## 2014-07-20 NOTE — Progress Notes (Signed)
UR completed 

## 2014-07-20 NOTE — Progress Notes (Signed)
Subjective: An episode of chest pain brief, but slightly diaphoretic   Objective: Vital signs in last 24 hours: Temp:  [97.7 F (36.5 C)-98.6 F (37 C)] 98.1 F (36.7 C) (02/08 0520) Pulse Rate:  [81-104] 95 (02/08 0520) Resp:  [14-20] 16 (02/08 0520) BP: (102-147)/(59-82) 109/61 mmHg (02/08 0520) SpO2:  [99 %-100 %] 99 % (02/08 0520) Weight:  [85 lb 3.2 oz (38.646 kg)-87 lb (39.463 kg)] 86 lb 10.3 oz (39.3 kg) (02/08 0520) Weight change:  Last BM Date: 07/18/14 Intake/Output from previous day: +1138 02/07 0701 - 02/08 0700 In: 1138.6 [I.V.:1138.6] Out: -  Intake/Output this shift:    PE: General:Pleasant affect, NAD, very thin, anxious Skin:Warm and dry, brisk capillary refill HEENT:normocephalic, sclera clear, mucus membranes moist Heart:S1S2 RRR without murmur, gallup, rub or click Lungs:clear without rales, rhonchi, or wheezes WUJ:WJXB, non tender, + BS, do not palpate liver spleen or masses Ext:no lower ext edema, 2+ pedal pulses, 2+ radial pulses Neuro:alert and oriented X 3, MAE, follows commands, + facial symmetry Tele: SR to ST HR up to 122 at times.    Lab Results:  Recent Labs  07/19/14 1155  WBC 5.9  HGB 13.4  HCT 39.9  PLT 286   BMET  Recent Labs  07/19/14 1155  NA 137  K 3.8  CL 101  CO2 28  GLUCOSE 97  BUN 9  CREATININE 0.59  CALCIUM 9.5    Recent Labs  07/19/14 1823 07/19/14 2245  TROPONINI 0.07* 0.07*    No results found for: CHOL, HDL, LDLCALC, LDLDIRECT, TRIG, CHOLHDL No results found for: JYNW2N   Lab Results  Component Value Date   TSH 2.797 07/19/2014    Hepatic Function Panel  Recent Labs  07/19/14 1155  PROT 8.5*  ALBUMIN 5.1  AST 23  ALT 13  ALKPHOS 68  BILITOT 1.2   No results for input(s): CHOL in the last 72 hours. No results for input(s): PROTIME in the last 72 hours.     Studies/Results: Dg Chest 2 View  07/19/2014   CLINICAL DATA:  Pt states she has had pain across her upper chest  x 2 weeks with dizziness and sob. States pain became worse 2 days ago. Hx of htn.  EXAM: CHEST  2 VIEW  COMPARISON:  10/13/2012  FINDINGS: The heart size and mediastinal contours are within normal limits. Both lungs are clear. The visualized skeletal structures are unremarkable.  IMPRESSION: No active cardiopulmonary disease.   Electronically Signed   By: Elberta Fortis M.D.   On: 07/19/2014 12:06   Ct Angio Chest Pe W/cm &/or Wo Cm  07/19/2014   CLINICAL DATA:  Initial encounter for upper chest pain for 2 weeks with shortness of breath. Fatigue. Dizziness. History of hypertension.  EXAM: CT ANGIOGRAPHY CHEST WITH CONTRAST  TECHNIQUE: Multidetector CT imaging of the chest was performed using the standard protocol during bolus administration of intravenous contrast. Multiplanar CT image reconstructions and MIPs were obtained to evaluate the vascular anatomy.  CONTRAST:  OMNIPAQUE IOHEXOL 350 MG/ML SOLN  COMPARISON:  Chest radiograph 07/19/2014  FINDINGS: Mediastinum/Nodes: The quality of this exam for evaluation of pulmonary embolism is good. No evidence of pulmonary embolism.  Normal aortic caliber without dissection. Heart size accentuated by a mild pectus excavatum deformity. No pericardial effusion. No mediastinal or hilar adenopathy.  Lungs/Pleura: No airspace opacities.  No pleural fluid.  Upper abdomen: Normal imaged portions of the liver, spleen, stomach, left kidney, and  adrenal glands.  Musculoskeletal: No acute osseous abnormality.  Review of the MIP images confirms the above findings.  IMPRESSION: 1.  No evidence of pulmonary embolism. 2. No other explanation for patient's symptoms.   Electronically Signed   By: Jeronimo GreavesKyle  Talbot M.D.   On: 07/19/2014 14:55    Medications: I have reviewed the patient's current medications. Scheduled Meds: . aspirin EC  81 mg Oral Daily  . atorvastatin  80 mg Oral q1800  . cholecalciferol  2,000 Units Oral QODAY  . famotidine  20 mg Oral BID  . sodium chloride   3 mL Intravenous Q12H   Continuous Infusions: . 0.9 % NaCl with KCl 20 mEq / L 100 mL/hr at 07/20/14 0458  . heparin 500 Units/hr (07/19/14 1537)   PRN Meds:.acetaminophen **OR** acetaminophen, alum & mag hydroxide-simeth, HYDROcodone-acetaminophen, LORazepam, ondansetron **OR** ondansetron (ZOFRAN) IV, senna-docusate  Assessment/Plan:  39 yo woman with PMH of hypertension on bysystolic, dyslipidemia who was seen in [facility] earlier today after presentation for chest pain. She characterizes the pain as intermittent, lasting 4-5 minutes and sharp in nature. Today she has some side/flank/back involvement that is more achy. She also complains of dizziness, "tingling" in her head. "Butterflies" on her ankles and right chest. Sweats, dyspnea. She was [facility] who prescribed Bystolic and Dexilant, but patient stopped both. She was to have a workup for pheochromocytoma.  She also had what sounds like a negative nuclear stress test in [place] but she is changing her insurance.  Mildly elevated troponin and [facility].    Principal Problem:   Elevated troponin- 0.14, --> 0.07-->0.07, 4th troponin in process- awaiting echo, She is NPO, on IV heparin,  She does have HR up to 122 at times without activity. Brief episode of shooting chest pain with mild diaphoresis.   Aware of "racing HR"   Fluids at 100 - would resume BB- unsure if ST old or related to stopping BB.  ? nuc study vs. Cath.  Active Problems:   Vitamin D deficiency   Chest pain- neg CT angio, continues with episodes.    History of panic attacks   GERD (gastroesophageal reflux disease)    LOS: 1 day   Time spent with pt. :15 minutes. [facility] Center[facility],LAURA R  Nurse Practitioner Certified Pager [phone] 360 2022 or after 5pm and on weekends call [phone] 07/20/2014, 9:59 AM   Patient seen and examined and history reviewed. Agree with above  findings and plan. 39 yo [place] female presents with very atypical chest pain. CT negative for PE. troponins are mildly elevated 0.14>.07>.07. Ecg without acute ST changes. She had a normal nuclear stress [facility] Oct[facility]. Awaiting Echo results. I think the best way to answer the question of whether there is ischemia is to do a cardiac CTA. If negative then we can reassure. Her HR is 90 now. Will give metoprolol 50 mg po now. May need to give some IV lopressor at the time of exam. Her review of systems is diffusely positive with sweats, tingling, etc.   Peter SwazilandJordan, MDFACC 07/20/2014 11:34 AM

## 2014-07-20 NOTE — Discharge Summary (Signed)
Discharge Summary  Charlene Pernaejalbahen Zuno MVH:[id number] DOB: 06-25-1975  PCP: No primary care provider on file.  Admit date: 07/19/2014 Discharge date: 07/20/2014  Time spent: less than 30mins  Recommendations for Outpatient Follow-up:  1. F/u with pcp dr. Loyal JacobsonMichael Kalish in a week  Discharge Diagnoses:  Active Hospital Problems   Diagnosis Date Noted  . Elevated troponin 07/19/2014  . Chest pain 07/19/2014  . History of panic attacks 07/19/2014  . GERD (gastroesophageal reflux disease) 07/19/2014  . Vitamin D deficiency 08/07/2012    Resolved Hospital Problems   Diagnosis Date Noted Date Resolved  No resolved problems to display.    Discharge Condition: stable  Diet recommendation: regular diet  Filed Weights   07/19/14 1518 07/19/14 1704 07/20/14 0520  Weight: 39.463 kg (87 lb) 38.646 kg (85 lb 3.2 oz) 39.3 kg (86 lb 10.3 oz)    History of present illness:  Charlene Rogers is a 39 y.o. [place] female  With h/o GERD, panic attacks, "borderline" hyperlipidemia presents with multiple symptoms. Left chest soreness, left axillary pain, scapula pain. Has been experiencing intermittently for several weeks. Not associated with activity. Last for a few minutes. Today, was much more severe so she came to the emergency room. She also complains of dizziness, "tingling" in her head. "Butterflies" on her ankles and right chest. Sweats, dyspnea. She was [facility] who prescribed Bystolic and Dexilant, but patient stopped both. She was to have a workup for pheochromocytoma. Had a normal TSH recently. In the emergency room, EKG was unremarkable, but troponin mildly elevated at 0.14. CT angiogram of the chest showed no PE or other abnormality.   Hospital Course:  Principal Problem:   Elevated troponin Active Problems:   Vitamin D deficiency   Chest pain   History of panic attacks   GERD (gastroesophageal reflux disease)  Principal Problem:  Elevated  troponin: patient received Asa/heparin drip, cardiology consulted, echo unremarkable. troponin reading likley spurious per cardiology,  Cardiology cleared patient to be discharged and f/u with pcp.    Active Problems:  Vitamin D deficiency  Chest pain  History of panic attacks  GERD (gastroesophageal reflux disease):  zantac.   Procedures:  echo  Consultations:  cardiology  Discharge Exam: BP 112/63 mmHg  Pulse 73  Temp(Src) 98 F (36.7 C) (Oral)  Resp 16  Ht 5\' 1"  (1.549 m)  Wt 39.3 kg (86 lb 10.3 oz)  BMI 16.38 kg/m2  SpO2 100%  LMP 07/05/2014 (Approximate)  General Appearance:   [name] female, slightly anxious appearing. Cooperative. Speech somewhat pressured.   Head:   Normocephalic, without obvious abnormality, atraumatic  Eyes:   PERRL, conjunctiva/corneas clear, EOM's intact, fundi   benign, both eyes  Nose:  Nares normal, septum midline, mucosa normal, no drainage   or sinus tenderness  Throat:  Lips, mucosa, and tongue normal; teeth and gums normal  Neck:  Supple, symmetrical, trachea midline, no adenopathy;   thyroid: no enlargement/tenderness/nodules; no carotid  bruit or JVD  Back:   Symmetric, no curvature, ROM normal, no CVA tenderness  Lungs:   Clear to auscultation bilaterally, respirations unlabored  Chest Wall:   No tenderness or deformity  Heart:   Regular rate and rhythm, S1 and S2 normal, no murmur, rub  or gallop  Breast Exam:   No tenderness, masses, or nipple abnormality  Abdomen:   Soft, non-tender, bowel sounds active,   no masses, no organomegaly  Genitalia:  deferred  Rectal:  deferred  Extremities:  Extremities normal,  atraumatic, no cyanosis or edema  Pulses:  2+ and symmetric all extremities  Skin:  Skin color, texture, turgor normal, no rashes or lesions  Lymph nodes:  Cervical, supraclavicular, and axillary nodes normal  Neurologic:  CNII-XII intact,  normal strength, sensation and reflexes   throughout  Psych: anxious    Discharge Instructions You were cared for by a hospitalist during your hospital stay. If you have any questions about your discharge medications or the care you received while you were in the hospital after you are discharged, you can call the unit and asked to speak with the hospitalist on call if the hospitalist that took care of you is not available. Once you are discharged, your primary care physician will handle any further medical issues. Please note that NO REFILLS for any discharge medications will be authorized once you are discharged, as it is imperative that you return to your primary care physician (or establish a relationship with a primary care physician if you do not have one) for your aftercare needs so that they can reassess your need for medications and monitor your lab values.     Medication List    TAKE these medications        clonazePAM 0.5 MG tablet  Commonly known as:  KLONOPIN  Take 1 tablet (0.5 mg total) by mouth 2 (two) times daily as needed for anxiety.     multivitamin with minerals Tabs tablet  Take 2 tablets by mouth daily.     ranitidine 150 MG tablet  Commonly known as:  ZANTAC  Take 150 mg by mouth daily.     Vitamin D3 5000 UNITS Tabs  Take 5,000 Units by mouth daily.       No Known Allergies     Follow-up Information    Follow up with Sid Falcon, MD In 1 week.   Specialty:  Family Medicine   Contact information:   [address] [place] [address] [phone]        The results of significant diagnostics from this hospitalization (including imaging, microbiology, ancillary and laboratory) are listed below for reference.    Significant Diagnostic Studies: Dg Chest 2 View  07/19/2014   CLINICAL DATA:  Pt states she has had pain across her upper chest x 2 weeks with dizziness and sob. States pain became worse 2  days ago. Hx of htn.  EXAM: CHEST  2 VIEW  COMPARISON:  10/13/2012  FINDINGS: The heart size and mediastinal contours are within normal limits. Both lungs are clear. The visualized skeletal structures are unremarkable.  IMPRESSION: No active cardiopulmonary disease.   Electronically Signed   By: Elberta Fortis M.D.   On: 07/19/2014 12:06   Ct Angio Chest Pe W/cm &/or Wo Cm  07/19/2014   CLINICAL DATA:  Initial encounter for upper chest pain for 2 weeks with shortness of breath. Fatigue. Dizziness. History of hypertension.  EXAM: CT ANGIOGRAPHY CHEST WITH CONTRAST  TECHNIQUE: Multidetector CT imaging of the chest was performed using the standard protocol during bolus administration of intravenous contrast. Multiplanar CT image reconstructions and MIPs were obtained to evaluate the vascular anatomy.  CONTRAST:  OMNIPAQUE IOHEXOL 350 MG/ML SOLN  COMPARISON:  Chest radiograph 07/19/2014  FINDINGS: Mediastinum/Nodes: The quality of this exam for evaluation of pulmonary embolism is good. No evidence of pulmonary embolism.  Normal aortic caliber without dissection. Heart size accentuated by a mild pectus excavatum deformity. No pericardial effusion. No mediastinal or hilar adenopathy.  Lungs/Pleura: No airspace opacities.  No pleural fluid.  Upper abdomen: Normal imaged portions of the liver, spleen, stomach, left kidney, and adrenal glands.  Musculoskeletal: No acute osseous abnormality.  Review of the MIP images confirms the above findings.  IMPRESSION: 1.  No evidence of pulmonary embolism. 2. No other explanation for patient's symptoms.   Electronically Signed   By: Jeronimo Greaves M.D.   On: 07/19/2014 14:55   Ct Coronary Morp W/cta Cor W/score W/ca W/cm &/or Wo/cm  07/20/2014   ADDENDUM REPORT: 07/20/2014 16:52  CLINICAL DATA:  39 year old female with atypical chest pain.  EXAM: Cardiac/Coronary  CT  TECHNIQUE: The patient was scanned on a Philips 256 scanner.  FINDINGS: A 120 kV prospective scan was triggered  in the descending thoracic aorta at 111 HU's. Axial non-contrast 3 mm slices were carried out through the heart. The data set was analyzed on a dedicated work station and scored using the Agatson method. Gantry rotation speed was 270 msecs and collimation was .9 mm. 50 mg of PO metoprolol,5 mg of iv Metoprolol and 0.4 mg of sl NTG was given. The 3D data set was reconstructed in 5% intervals of the 67-82 % of the R-R cycle. Diastolic phases were analyzed on a dedicated work station using MPR, MIP and VRT modes. The patient received 80 cc of contrast.  Aorta:  Normal caliber, no dissection, no calcifications.  Aortic Valve:  Trileaflet.  Coronary Arteries:  Normal origin, right dominance.  Left main - normal, no plaque  LAD is a large caliber long vessel that wraps around the apex and continues in the in inferior interventricular groove supplying part of the PDA territory. LAD gives rise to one diagonal branch.  LCX artery is a medium caliber non-dominant vessel that gives rise to one OM branch. There is no plaque.  RCA is a large caliber dominant vessel that gives rise to a small PDA and PLVB. There is significant motion, however no obvious plaque is seen.  IMPRESSION: 1. Coronary calcium score of 0. This was 0 percentile for age and sex matched control.  2.  Normal origin of coronary arteries.  3.  Right dominance.  4. Study interpretation is affected by cardiac motion predominantly in RCA, however there appears to be no evidence of CAD.  Evaluate for non-cardiac sources of chest pain.  Tobias Alexander   Electronically Signed   By: Tobias Alexander   On: 07/20/2014 16:52   07/20/2014   EXAM: OVER-READ INTERPRETATION  CT CHEST  The following report is an over-read performed by radiologist Dr. Charline Bills of [facility] on 07/20/2014. This over-read does not include interpretation of cardiac or coronary anatomy or pathology. The coronary CTA interpretation by the cardiologist is attached.  COMPARISON:   CT chest dated 07/19/2014  FINDINGS: Visualized lungs are clear.  No suspicious pulmonary nodules.  Visualized soft tissues are unremarkable. No suspicious mediastinal lymphadenopathy.  No focal osseous lesions.  IMPRESSION: No significant extracardiac findings.  Electronically Signed: By: Charline Bills M.D. On: 07/20/2014 15:34    Microbiology: No results found for this or any previous visit (from the past 240 hour(s)).   Labs: Basic Metabolic Panel:  Recent Labs Lab 07/19/14 1155  NA 137  K 3.8  CL 101  CO2 28  GLUCOSE 97  BUN 9  CREATININE 0.59  CALCIUM 9.5   Liver Function Tests:  Recent Labs Lab 07/19/14 1155  AST 23  ALT 13  ALKPHOS 68  BILITOT 1.2  PROT 8.5*  ALBUMIN 5.1    Recent Labs Lab 07/19/14 1155  LIPASE 33   No results for input(s): AMMONIA in the last 168 hours. CBC:  Recent Labs Lab 07/19/14 1155 07/20/14 0936  WBC 5.9 4.0  NEUTROABS 4.3  --   HGB 13.4 12.2  HCT 39.9 37.1  MCV 90.1 91.2  PLT 286 234   Cardiac Enzymes:  Recent Labs Lab 07/19/14 1155 07/19/14 1823 07/19/14 2245 07/20/14 0936  TROPONINI 0.14* 0.07* 0.07* 0.06*   BNP: BNP (last 3 results) No results for input(s): BNP in the last 8760 hours.  ProBNP (last 3 results) No results for input(s): PROBNP in the last 8760 hours.  CBG: No results for input(s): GLUCAP in the last 168 hours.     Signed:  Adhrit Krenz  [facility] 07/20/2014, 5:20 PM

## 2014-07-21 LAB — HIV ANTIBODY (ROUTINE TESTING W REFLEX): HIV SCREEN 4TH GENERATION: NONREACTIVE
